# Patient Record
Sex: Female | Born: 2000
Health system: Southern US, Community
[De-identification: ages and names within clinical notes are randomized; demographics above are authoritative.]

## PROBLEM LIST (undated history)

## (undated) DIAGNOSIS — T7840XA Allergy, unspecified, initial encounter: Secondary | ICD-10-CM

## (undated) HISTORY — PX: WISDOM TOOTH EXTRACTION: SHX21

## (undated) HISTORY — DX: Allergy, unspecified, initial encounter: T78.40XA

---
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
## 2018-01-27 ENCOUNTER — Encounter: Payer: Self-pay | Admitting: Family Medicine

## 2018-01-27 ENCOUNTER — Ambulatory Visit (INDEPENDENT_AMBULATORY_CARE_PROVIDER_SITE_OTHER): Payer: Medicaid Other | Admitting: Family Medicine

## 2018-01-27 VITALS — BP 115/81 | Ht 62.0 in | Wt 130.0 lb

## 2018-01-27 DIAGNOSIS — M25562 Pain in left knee: Secondary | ICD-10-CM

## 2018-01-27 DIAGNOSIS — G8929 Other chronic pain: Secondary | ICD-10-CM | POA: Diagnosis not present

## 2018-01-27 DIAGNOSIS — M25561 Pain in right knee: Secondary | ICD-10-CM

## 2018-01-27 NOTE — Progress Notes (Signed)
Subjective:    Mary Glover is a 17 y.o. old female here with bilateral knee pain.  She with her mother.  HPI Knee pain: Chronic issue for the last 5 to 6 years.  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  She denies history of trauma or injury.  Pain is diffuse around her knee mainly anteriorly around the kneecap.  Pain is worse in the right knee than left.  Reports worsening of hip pain over the last 1 months.  She denies unusual activity.  She just graduated from high school.  She also reports intermittent numbness and tingling in her lower legs globally.  She noted some popping or locking when she moves in certain ways.  She also reports some swelling in her lower legs and feet bilaterally when she sits or stands for long time.  She reports multiple ED visits for similar problem over the last for 5 years.  She was told she just had some fluid in her knees and groin pain. She was seen in clinic here for similar problem about 4 months ago.  There was a concern about patellofemoral syndrome and hypermobility syndrome.  She was recommended wearing a knee brace, quad muscle strengthening exercise and naproxen as needed for pain.  She says she has not noticed improvement with the above measures. Denies fever, chills or overlying skin erythema. No family history of rheumatoid disease.  Mom with history of diabetes. She has no PCP. PMH/Problem List: has Patellofemoral pain syndrome of both knees and Hypermobility syndrome on their problem list.   has no past medical history on file.  FH:  No family history on file.  SH Social History   Tobacco Use  . Smoking status: Passive Smoke Exposure - Never Smoker  . Smokeless tobacco: Never Used  Substance Use Topics  . Alcohol use: No  . Drug use: No    Review of Systems Review of systems negative except for pertinent positives and negatives in history of present illness above.     Objective:     Vitals:   01/27/18 1128  BP: 115/81  Weight: 130 lb (59 kg)  Height: 5\' 2"  (1.575 m)   Body mass index is 23.78 kg/m.  Physical Exam  GEN: appears well & comfortable. No apparent distress. Head: normocephalic and atraumatic  CVS: DP pulses 2+ bilaterally MSK:  Knee exam Normal to inspection with no erythema, effusion, swelling or obvious bony abnormalities. No warmth to touch.  Mild vague and diffuse tenderness around her knee anteriorly with palpation ROM full in flexion and extension and lower leg rotation. Patellar glide without crepitus. Notable joint laxity with valgus and varus stress bilaterally. ACL and PCL intact. Mild pain patellar compression. No significant McMurray sign Normal gait  Neurovascularly intact with good distal pulses.  SKIN: no apparent skin lesion Assessment and Plan:  1. Chronic pain of both knees: exam with notable hypermobility in her knee joints which could be contributing to her discomfort.  I have no explanation for her intermittent swelling and numbness and tingling.  She has no swelling today.  Neuro exam within normal limits.  No history and exam suggestive for inflammatory or infectious etiology.  Both patient and mother appears frustrated with lack of good explanation for her pain and swelling.  Recommended continuing quad strengthening exercise and knee brace as needed.  Recommended finding a PCP she can follow up with.   Almon Herculesaye T Gonfa, MD 01/27/18 Pager: [phone]

## 2018-01-27 NOTE — Progress Notes (Signed)
[facility] Attending Note: I have seen and examined this patient. I have discussed this patient with the resident and reviewed the assessment and plan as documented above. I agree with the resident's findings and plan. She is complaining of intermittent severe swelling and pain of knees, legs ankles and feet. Sometimes uniltaeral, sometimes both. Says she cannot bend her knee because it hurts (right knee) but during exam if she is distracted she will bend it and let me bend it and similar results during gait analysis. I find no structural problems with her knees. I see no swelling. She and Mom are frustrated as I tell them this and I recommend they return to their PCP for more comprehensive evaluation.  I suspect there is a more global issue at play .

## 2018-03-01 ENCOUNTER — Ambulatory Visit: Payer: Medicaid Other | Attending: Nurse Practitioner | Admitting: Nurse Practitioner

## 2018-03-01 ENCOUNTER — Other Ambulatory Visit: Payer: Self-pay | Admitting: Nurse Practitioner

## 2018-03-01 ENCOUNTER — Ambulatory Visit (HOSPITAL_COMMUNITY)
Admission: RE | Admit: 2018-03-01 | Discharge: 2018-03-01 | Disposition: A | Discharge status: Home / Self Care | Payer: Medicaid Other | Source: Ambulatory Visit | Attending: Nurse Practitioner | Admitting: Nurse Practitioner

## 2018-03-01 ENCOUNTER — Encounter: Payer: Self-pay | Admitting: Nurse Practitioner

## 2018-03-01 VITALS — BP 113/76 | HR 90 | Temp 99.5°F | Ht 63.0 in | Wt 124.6 lb

## 2018-03-01 DIAGNOSIS — M25561 Pain in right knee: Secondary | ICD-10-CM | POA: Diagnosis not present

## 2018-03-01 DIAGNOSIS — G8929 Other chronic pain: Secondary | ICD-10-CM | POA: Insufficient documentation

## 2018-03-01 DIAGNOSIS — Z833 Family history of diabetes mellitus: Secondary | ICD-10-CM | POA: Insufficient documentation

## 2018-03-01 DIAGNOSIS — M25562 Pain in left knee: Secondary | ICD-10-CM | POA: Diagnosis not present

## 2018-03-01 DIAGNOSIS — Z79899 Other long term (current) drug therapy: Secondary | ICD-10-CM | POA: Insufficient documentation

## 2018-03-01 DIAGNOSIS — Z8249 Family history of ischemic heart disease and other diseases of the circulatory system: Secondary | ICD-10-CM | POA: Insufficient documentation

## 2018-03-01 NOTE — Progress Notes (Signed)
Assessment & Plan:  Mary Glover was seen today for new patient (initial visit).  Diagnoses and all orders for this visit:  Chronic pain of both knees -     Ambulatory referral to Physical Therapy -     DG Knee Complete 4 Views Left; Future -     DG Knee Complete 4 Views Right; Future  May alternate with heat and ice application for pain relief. May also alternate with acetaminophen and Ibuprofen as prescribed for pain. Other alternatives include massage, acupuncture and water aerobics.  You must stay active and avoid a sedentary lifestyle.    Patient has been counseled on age-appropriate routine health concerns for screening and prevention. These are reviewed and up-to-date. Referrals have been placed accordingly. Immunizations are up-to-date or declined.    Subjective:   Chief Complaint  Patient presents with  . New Patient (Initial Visit)    Patient is here for leg pain. Pt. stated she have pain all down from her back to her ankle.   HPI Mary Glover Glover 17 y.o. female presents to office today to establish care and with complaints of bilateral knee pain.  Knee Pain Patient presents for follow up on a knee problem involving the  bilateral knees. Onset of the symptoms was several years ago since the 7th grade.  Inciting event: this is a longstanding problem which has been getting worse. Previous injuries right ankle sprain and muscle strain from a fall many years ago. She did play volleyball and track field.  Current symptoms include pain located patellar pain, popping sensation, stiffness and swelling. Pain is aggravated by prolonged sitting and standing >2-3 hours, sleeping in fetal position.  Patient has had prior knee problems. Evaluation to date: Sports medicine evaluation. Treatment to date: avoidance of offending activity, brace which is somewhat effective, prescription NSAIDS which are somewhat effective and sports medicine evaluation. Relieving factors: stretching.   Review of  Systems  Constitutional: Negative for fever, malaise/fatigue and weight loss.  HENT: Negative.  Negative for nosebleeds.   Eyes: Negative.  Negative for blurred vision, double vision and photophobia.  Respiratory: Negative.  Negative for cough and shortness of breath.   Cardiovascular: Negative.  Negative for chest pain, palpitations and leg swelling.  Gastrointestinal: Negative.  Negative for heartburn, nausea and vomiting.  Musculoskeletal: Positive for back pain and joint pain. Negative for myalgias.       SEE HPI  Neurological: Negative.  Negative for dizziness, focal weakness, seizures and headaches.  Psychiatric/Behavioral: Negative.  Negative for suicidal ideas.    History reviewed. No pertinent past medical history.  History reviewed. No pertinent surgical history.  Family History  Problem Relation Age of Onset  . Diabetes Mother   . Hypertension Father     Social History Reviewed with no changes to be made today.   Outpatient Medications Prior to Visit  Medication Sig Dispense Refill  . amoxicillin-clavulanate (AUGMENTIN) 875-125 MG tablet TAKE 1 TABET BY MOUTH EVERY 12 HOUR  0  . metroNIDAZOLE (FLAGYL) 500 MG tablet Take 1 tablet (500 mg total) by mouth 2 (two) times daily. (Patient not taking: Reported on 03/01/2018) 14 tablet 0  . naproxen (NAPROSYN) 500 MG tablet Take 1 tablet (500 mg total) by mouth 2 (two) times daily as needed. (Patient not taking: Reported on 03/01/2018) 60 tablet 1   No facility-administered medications prior to visit.     No Known Allergies     Objective:    BP 113/76 (BP Location: Right Arm, Patient Position: Sitting,  Cuff Size: Normal)   Pulse 90   Temp 99.5 F (37.5 C) (Oral)   Ht 5\' 3"  (1.6 m)   Wt 124 lb 9.6 oz (56.5 kg)   SpO2 100%   BMI 22.07 kg/m  Wt Readings from Last 3 Encounters:  03/01/18 124 lb 9.6 oz (56.5 kg) (44 %, Z= -0.15)*  01/27/18 130 lb (59 kg) (55 %, Z= 0.12)*  09/23/17 119 lb (54 kg) (35 %, Z= -0.40)*   *  Growth percentiles are based on CDC (Girls, 2-20 Years) data.    Physical Exam  Constitutional: She is oriented to person, place, and time. She appears well-developed and well-nourished. She is cooperative.  HENT:  Head: Normocephalic and atraumatic.  Eyes: EOM are normal.  Neck: Normal range of motion.  Cardiovascular: Normal rate, regular rhythm, normal heart sounds and intact distal pulses. Exam reveals no gallop and no friction rub.  No murmur heard. Pulmonary/Chest: Effort normal and breath sounds normal. No tachypnea. No respiratory distress. She has no decreased breath sounds. She has no wheezes. She has no rhonchi. She has no rales. She exhibits no tenderness.  Abdominal: Soft. Bowel sounds are normal.  Musculoskeletal: Normal range of motion. She exhibits no edema.       Right knee: She exhibits normal range of motion and no swelling. Tenderness found. Patellar tendon tenderness noted.       Left knee: She exhibits normal range of motion and no swelling. Tenderness found. Patellar tendon tenderness noted.  Neurological: She is alert and oriented to person, place, and time. Coordination normal.  Skin: Skin is warm and dry.  Psychiatric: She has a normal mood and affect. Her behavior is normal. Judgment and thought content normal.  Nursing note and vitals reviewed.      Patient has been counseled extensively about nutrition and exercise as well as the importance of adherence with medications and regular follow-up. The patient was given clear instructions to go to ER or return to medical center if symptoms don't improve, worsen or new problems develop. The patient verbalized understanding.   Follow-up: Return for Patient to call and make appointment. Claiborne Rigg, FNP-BC [facility] and [facility] [place], [place] [phone]   03/01/2018, 10:14 AM

## 2018-03-01 NOTE — Patient Instructions (Signed)
Knee Exercises Ask your health care provider which exercises are safe for you. Do exercises exactly as told by your health care provider and adjust them as directed. It is normal to feel mild stretching, pulling, tightness, or discomfort as you do these exercises, but you should stop right away if you feel sudden pain or your pain gets worse.Do not begin these exercises until told by your health care provider. STRETCHING AND RANGE OF MOTION EXERCISES These exercises warm up your muscles and joints and improve the movement and flexibility of your knee. These exercises also help to relieve pain, numbness, and tingling. Exercise A: Knee Extension, Prone 1. Lie on your abdomen on a bed. 2. Place your left / right knee just beyond the edge of the surface so your knee is not on the bed. You can put a towel under your left / right thigh just above your knee for comfort. 3. Relax your leg muscles and allow gravity to straighten your knee. You should feel a stretch behind your left / right knee. 4. Hold this position for __________ seconds. 5. Scoot up so your knee is supported between repetitions. Repeat __________ times. Complete this stretch __________ times a day. Exercise B: Knee Flexion, Active  1. Lie on your back with both knees straight. If this causes back discomfort, bend your left / right knee so your foot is flat on the floor. 2. Slowly slide your left / right heel back toward your buttocks until you feel a gentle stretch in the front of your knee or thigh. 3. Hold this position for __________ seconds. 4. Slowly slide your left / right heel back to the starting position. Repeat __________ times. Complete this exercise __________ times a day. Exercise C: Quadriceps, Prone  1. Lie on your abdomen on a firm surface, such as a bed or padded floor. 2. Bend your left / right knee and hold your ankle. If you cannot reach your ankle or pant leg, loop a belt around your foot and grab the belt  instead. 3. Gently pull your heel toward your buttocks. Your knee should not slide out to the side. You should feel a stretch in the front of your thigh and knee. 4. Hold this position for __________ seconds. Repeat __________ times. Complete this stretch __________ times a day. Exercise D: Hamstring, Supine 1. Lie on your back. 2. Loop a belt or towel over the ball of your left / right foot. The ball of your foot is on the walking surface, right under your toes. 3. Straighten your left / right knee and slowly pull on the belt to raise your leg until you feel a gentle stretch behind your knee. ? Do not let your left / right knee bend while you do this. ? Keep your other leg flat on the floor. 4. Hold this position for __________ seconds. Repeat __________ times. Complete this stretch __________ times a day. STRENGTHENING EXERCISES These exercises build strength and endurance in your knee. Endurance is the ability to use your muscles for a long time, even after they get tired. Exercise E: Quadriceps, Isometric  1. Lie on your back with your left / right leg extended and your other knee bent. Put a rolled towel or small pillow under your knee if told by your health care provider. 2. Slowly tense the muscles in the front of your left / right thigh. You should see your kneecap slide up toward your hip or see increased dimpling just above the knee. This   motion will push the back of the knee toward the floor. 3. For __________ seconds, keep the muscle as tight as you can without increasing your pain. 4. Relax the muscles slowly and completely. Repeat __________ times. Complete this exercise __________ times a day. Exercise F: Straight Leg Raises - Quadriceps 1. Lie on your back with your left / right leg extended and your other knee bent. 2. Tense the muscles in the front of your left / right thigh. You should see your kneecap slide up or see increased dimpling just above the knee. Your thigh may  even shake a bit. 3. Keep these muscles tight as you raise your leg 4-6 inches (10-15 cm) off the floor. Do not let your knee bend. 4. Hold this position for __________ seconds. 5. Keep these muscles tense as you lower your leg. 6. Relax your muscles slowly and completely after each repetition. Repeat __________ times. Complete this exercise __________ times a day. Exercise G: Hamstring, Isometric 1. Lie on your back on a firm surface. 2. Bend your left / right knee approximately __________ degrees. 3. Dig your left / right heel into the surface as if you are trying to pull it toward your buttocks. Tighten the muscles in the back of your thighs to dig as hard as you can without increasing any pain. 4. Hold this position for __________ seconds. 5. Release the tension gradually and allow your muscles to relax completely for __________ seconds after each repetition. Repeat __________ times. Complete this exercise __________ times a day. Exercise H: Hamstring Curls  If told by your health care provider, do this exercise while wearing ankle weights. Begin with __________ weights. Then increase the weight by 1 lb (0.5 kg) increments. Do not wear ankle weights that are more than __________. 1. Lie on your abdomen with your legs straight. 2. Bend your left / right knee as far as you can without feeling pain. Keep your hips flat against the floor. 3. Hold this position for __________ seconds. 4. Slowly lower your leg to the starting position.  Repeat __________ times. Complete this exercise __________ times a day. Exercise I: Squats (Quadriceps) 1. Stand in front of a table, with your feet and knees pointing straight ahead. You may rest your hands on the table for balance but not for support. 2. Slowly bend your knees and lower your hips like you are going to sit in a chair. ? Keep your weight over your heels, not over your toes. ? Keep your lower legs upright so they are parallel with the table  legs. ? Do not let your hips go lower than your knees. ? Do not bend lower than told by your health care provider. ? If your knee pain increases, do not bend as low. 3. Hold the squat position for __________ seconds. 4. Slowly push with your legs to return to standing. Do not use your hands to pull yourself to standing. Repeat __________ times. Complete this exercise __________ times a day. Exercise J: Wall Slides (Quadriceps)  1. Lean your back against a smooth wall or door while you walk your feet out 18-24 inches (46-61 cm) from it. 2. Place your feet hip-width apart. 3. Slowly slide down the wall or door until your knees bend __________ degrees. Keep your knees over your heels, not over your toes. Keep your knees in line with your hips. 4. Hold for __________ seconds. Repeat __________ times. Complete this exercise __________ times a day. Exercise K: Straight Leg Raises -   Hip Abductors 1. Lie on your side with your left / right leg in the top position. Lie so your head, shoulder, knee, and hip line up. You may bend your bottom knee to help you keep your balance. 2. Roll your hips slightly forward so your hips are stacked directly over each other and your left / right knee is facing forward. 3. Leading with your heel, lift your top leg 4-6 inches (10-15 cm). You should feel the muscles in your outer hip lifting. ? Do not let your foot drift forward. ? Do not let your knee roll toward the ceiling. 4. Hold this position for __________ seconds. 5. Slowly return your leg to the starting position. 6. Let your muscles relax completely after each repetition. Repeat __________ times. Complete this exercise __________ times a day. Exercise L: Straight Leg Raises - Hip Extensors 1. Lie on your abdomen on a firm surface. You can put a pillow under your hips if that is more comfortable. 2. Tense the muscles in your buttocks and lift your left / right leg about 4-6 inches (10-15 cm). Keep your knee  straight as you lift your leg. 3. Hold this position for __________ seconds. 4. Slowly lower your leg to the starting position. 5. Let your leg relax completely after each repetition. Repeat __________ times. Complete this exercise __________ times a day. This information is not intended to replace advice given to you by your health care provider. Make sure you discuss any questions you have with your health care provider. Document Released: 06/02/2005 Document Revised: 04/12/2016 Document Reviewed: 05/25/2015 Elsevier Interactive Patient Education  2018 [employer].  Patellofemoral Pain Syndrome Patellofemoral pain syndrome is a condition that involves a softening or breakdown of the tissue (cartilage) on the underside of your kneecap (patella). This causes pain in the front of the knee. The condition is also called runner's knee or chondromalacia patella. Patellofemoral pain syndrome is most common in young adults who are active in sports. Your knee is the largest joint in your body. The patella covers the front of your knee and is attached to muscles above and below your knee. The underside of the patella is covered with a smooth type of cartilage (synovium). The smooth surface helps the patella glide easily when you move your knee. Patellofemoral pain syndrome causes swelling in the joint linings and bone surfaces in your knee. What are the causes? Patellofemoral pain syndrome can be caused by:  Overuse.  Poor alignment of your knee joints.  Weak leg muscles.  A direct blow to your kneecap.  What increases the risk? You may be at risk for patellofemoral pain syndrome if you:  Do a lot of activities that can wear down your kneecap. These include: ? Running. ? Squatting. ? Climbing stairs.  Start a new physical activity or exercise program.  Wear shoes that do not fit well.  Do not have good leg strength.  Are overweight.  What are the signs or symptoms? Knee pain is the  most common symptom of patellofemoral pain syndrome. This may feel like a dull, aching pain underneath your patella, in the front of your knee. There may be a popping or cracking sound when you move your knee. Pain may get worse with:  Exercise.  Climbing stairs.  Running.  Jumping.  Squatting.  Kneeling.  Sitting for a long time.  Moving or pushing on your patella.  How is this diagnosed? Your health care provider may be able to diagnose patellofemoral pain syndrome  from your symptoms and medical history. You may be asked about your recent physical activities and which ones cause knee pain. Your health care provider may do a physical exam with certain tests to confirm the diagnosis. These may include:  Moving your patella back and forth.  Checking your range of knee motion.  Having you squat or jump to see if you have pain.  Checking the strength of your leg muscles.  An MRI of the knee may also be done. How is this treated? Patellofemoral pain syndrome can usually be treated at home with rest, ice, compression, and elevation (RICE). Other treatments may include:  Nonsteroidal anti-inflammatory drugs (NSAIDs).  Physical therapy to stretch and strengthen your leg muscles.  Shoe inserts (orthotics) to take stress off your knee.  A knee brace or knee support.  Surgery to remove damaged cartilage or move the patella to a better position. The need for surgery is rare.  Follow these instructions at home:  Take medicines only as directed by your health care provider.  Rest your knee. ? When resting, keep your knee raised above the level of your heart. ? Avoid activities that cause knee pain.  Apply ice to the injured area: ? Put ice in a plastic bag. ? Place a towel between your skin and the bag. ? Leave the ice on for 20 minutes, 2-3 times a day.  Use splints, braces, knee supports, or walking aids as directed by your health care provider.  Perform stretching and  strengthening exercises as directed by your health care provider or physical therapist.  Keep all follow-up visits as directed by your health care provider. This is important. Contact a health care provider if:  Your symptoms get worse.  You are not improving with home care. This information is not intended to replace advice given to you by your health care provider. Make sure you discuss any questions you have with your health care provider. Document Released: 07/07/2009 Document Revised: 12/25/2015 Document Reviewed: 10/08/2013 Elsevier Interactive Patient Education  2018 [employer] Inc[employer]  Knee Pain, Adult Many things can cause knee pain. The pain often goes away on its own with time and rest. If the pain does not go away, tests may be done to find out what is causing the pain. Follow these instructions at home: Activity  Rest your knee.  Do not do things that cause pain.  Avoid activities where both feet leave the ground at the same time (high-impact activities). Examples are running, jumping rope, and doing jumping jacks. General instructions  Take medicines only as told by your doctor.  Raise (elevate) your knee when you are resting. Make sure your knee is higher than your heart.  Sleep with a pillow under your knee.  If told, put ice on the knee: ? Put ice in a plastic bag. ? Place a towel between your skin and the bag. ? Leave the ice on for 20 minutes, 2-3 times a day.  Ask your doctor if you should wear an elastic knee support.  Lose weight if you are overweight. Being overweight can make your knee hurt more.  Do not use any tobacco products. These include cigarettes, chewing tobacco, or electronic cigarettes. If you need help quitting, ask your doctor. Smoking may slow down healing. Contact a doctor if:  The pain does not stop.  The pain changes or gets worse.  You have a fever along with knee pain.  Your knee gives out or locks up.  Your knee swells,  and  becomes worse. Get help right away if:  Your knee feels warm.  You cannot move your knee.  You have very bad knee pain.  You have chest pain.  You have trouble breathing. Summary  Many things can cause knee pain. The pain often goes away on its own with time and rest.  Avoid activities that put stress on your knee. These include running and jumping rope.  Get help right away if you cannot move your knee, or if your knee feels warm, or if you have trouble breathing. This information is not intended to replace advice given to you by your health care provider. Make sure you discuss any questions you have with your health care provider. Document Released: 10/15/2008 Document Revised: 07/13/2016 Document Reviewed: 07/13/2016 Elsevier Interactive Patient Education  2017 [employer]

## 2018-03-02 ENCOUNTER — Telehealth: Payer: Self-pay

## 2018-03-02 NOTE — Telephone Encounter (Signed)
-----   Message from Claiborne RiggZelda W Fleming, NP sent at 03/01/2018 10:21 PM EDT ----- Left knee xray is normal. Will refer to PT

## 2018-03-02 NOTE — Telephone Encounter (Signed)
CMA attempt to call patient to inform on XRAY results.  No answer and left a VM for patient to call back.  If patient call back, please inform:  Right knee xray is normal  Left knee xray is normal. Will refer to PT

## 2018-03-02 NOTE — Telephone Encounter (Signed)
-----   Message from Claiborne RiggZelda W Fleming, NP sent at 03/01/2018 10:20 PM EDT ----- Right knee xray is normal

## 2018-03-07 ENCOUNTER — Telehealth: Payer: Self-pay | Admitting: Nurse Practitioner

## 2018-03-07 ENCOUNTER — Other Ambulatory Visit: Payer: Self-pay | Admitting: Nurse Practitioner

## 2018-03-07 NOTE — Telephone Encounter (Signed)
Please call the patient at [phone]

## 2018-03-10 NOTE — Telephone Encounter (Signed)
Left a message for the patient to call me back to be scheduled.

## 2018-03-14 ENCOUNTER — Ambulatory Visit: Payer: Medicaid Other | Admitting: Physical Therapy

## 2018-03-15 ENCOUNTER — Ambulatory Visit: Payer: Medicaid Other | Attending: Nurse Practitioner | Admitting: Nurse Practitioner

## 2018-03-15 ENCOUNTER — Encounter: Payer: Self-pay | Admitting: Nurse Practitioner

## 2018-03-15 ENCOUNTER — Other Ambulatory Visit: Payer: Self-pay

## 2018-03-15 ENCOUNTER — Encounter: Payer: Self-pay | Admitting: Physical Therapy

## 2018-03-15 ENCOUNTER — Ambulatory Visit: Payer: Medicaid Other | Attending: Nurse Practitioner | Admitting: Physical Therapy

## 2018-03-15 VITALS — BP 109/69 | HR 87 | Temp 98.9°F | Ht 63.0 in | Wt 122.6 lb

## 2018-03-15 DIAGNOSIS — Z0000 Encounter for general adult medical examination without abnormal findings: Secondary | ICD-10-CM | POA: Diagnosis not present

## 2018-03-15 DIAGNOSIS — M7989 Other specified soft tissue disorders: Secondary | ICD-10-CM | POA: Insufficient documentation

## 2018-03-15 DIAGNOSIS — M25561 Pain in right knee: Secondary | ICD-10-CM | POA: Diagnosis not present

## 2018-03-15 DIAGNOSIS — Z79899 Other long term (current) drug therapy: Secondary | ICD-10-CM | POA: Diagnosis not present

## 2018-03-15 DIAGNOSIS — M25562 Pain in left knee: Secondary | ICD-10-CM

## 2018-03-15 DIAGNOSIS — R262 Difficulty in walking, not elsewhere classified: Secondary | ICD-10-CM | POA: Insufficient documentation

## 2018-03-15 DIAGNOSIS — G8929 Other chronic pain: Secondary | ICD-10-CM | POA: Insufficient documentation

## 2018-03-15 DIAGNOSIS — R600 Localized edema: Secondary | ICD-10-CM | POA: Insufficient documentation

## 2018-03-15 NOTE — Progress Notes (Signed)
Assessment & Plan:  Mary Glover was seen today for follow-up.  Diagnoses and all orders for this visit:  Chronic pain of both knees -     Rheumatoid factor -     ANA -     Sedimentation Rate  Routine health maintenance -     CBC -     Basic metabolic panel    Patient has been counseled on age-appropriate routine health concerns for screening and prevention. These are reviewed and up-to-date. Referrals have been placed accordingly. Immunizations are up-to-date or declined.    Subjective:   Chief Complaint  Patient presents with  . Follow-up    Pt. is here for lab. Pt. stated her feet and knee are swollen.    HPI Mary Glover 17 y.o. female presents to office today for follow up to chronic bilateral knee pain and swelling.   Knee Pain Recent Xrays of both knees (03-01-2018) were negative for any osseous abnormalities, effusions or fractures. I have referred her for OP Physical Therapy. She will see the physical therapist for the first time today. Today she continues to endorse pain and swelling in her knees and feet. Pain is greater in the right knee. She also endorses dull aching low back from that has been persistent from a fall over 3 years ago. Xray at that time was negative.  I have instructed her to speak with the physical therapist today regarding her back pain as well. There may be some helpful exercises she can perform to assist with her low back pain. She has taken ibuprofen in the past and endorses good relief of symptoms.     Review of Systems  Constitutional: Negative for fever, malaise/fatigue and weight loss.  HENT: Negative.  Negative for nosebleeds.   Eyes: Negative.  Negative for blurred vision, double vision and photophobia.  Respiratory: Negative.  Negative for cough and shortness of breath.   Cardiovascular: Positive for leg swelling. Negative for chest pain and palpitations.  Gastrointestinal: Negative.  Negative for heartburn, nausea and vomiting.    Musculoskeletal: Positive for back pain, joint pain and myalgias.  Neurological: Negative.  Negative for dizziness, focal weakness, seizures and headaches.  Psychiatric/Behavioral: Negative.  Negative for suicidal ideas.    History reviewed. No pertinent past medical history.  History reviewed. No pertinent surgical history.  Family History  Problem Relation Age of Onset  . Diabetes Mother   . Hypertension Father     Social History Reviewed with no changes to be made today.   Outpatient Medications Prior to Visit  Medication Sig Dispense Refill  . amoxicillin-clavulanate (AUGMENTIN) 875-125 MG tablet TAKE 1 TABET BY MOUTH EVERY 12 HOUR  0  . metroNIDAZOLE (FLAGYL) 500 MG tablet Take 1 tablet (500 mg total) by mouth 2 (two) times daily. (Patient not taking: Reported on 03/01/2018) 14 tablet 0  . naproxen (NAPROSYN) 500 MG tablet Take 1 tablet (500 mg total) by mouth 2 (two) times daily as needed. (Patient not taking: Reported on 03/01/2018) 60 tablet 1   No facility-administered medications prior to visit.     No Known Allergies     Objective:    BP 109/69 (BP Location: Right Arm, Patient Position: Sitting, Cuff Size: Normal)   Pulse 87   Temp 98.9 F (37.2 C) (Oral)   Ht 5\' 3"  (1.6 m)   Wt 122 lb 9.6 oz (55.6 kg)   SpO2 99%   BMI 21.72 kg/m  Wt Readings from Last 3 Encounters:  03/15/18 122 lb 9.6  oz (55.6 kg) (40 %, Z= -0.25)*  03/01/18 124 lb 9.6 oz (56.5 kg) (44 %, Z= -0.15)*  01/27/18 130 lb (59 kg) (55 %, Z= 0.12)*   * Growth percentiles are based on CDC (Girls, 2-20 Years) data.    Physical Exam  Constitutional: She is oriented to person, place, and time. She appears well-developed and well-nourished. She is cooperative.  HENT:  Head: Normocephalic and atraumatic.  Eyes: EOM are normal.  Neck: Normal range of motion.  Cardiovascular: Normal rate, regular rhythm and normal heart sounds. Exam reveals no gallop and no friction rub.  No murmur  heard. Pulmonary/Chest: Effort normal and breath sounds normal. No tachypnea. No respiratory distress. She has no decreased breath sounds. She has no wheezes. She has no rhonchi. She has no rales. She exhibits no tenderness.  Abdominal: Bowel sounds are normal.  Musculoskeletal: Normal range of motion. She exhibits no edema.       Right knee: She exhibits normal range of motion, no swelling and no effusion.       Left knee: She exhibits normal range of motion, no swelling and no effusion.       Right foot: There is no swelling.       Left foot: There is no swelling.  Neurological: She is alert and oriented to person, place, and time. Coordination normal.  Skin: Skin is warm and dry.  Psychiatric: She has a normal mood and affect. Her behavior is normal. Judgment and thought content normal.  Nursing note and vitals reviewed.        Patient has been counseled extensively about nutrition and exercise as well as the importance of adherence with medications and regular follow-up. The patient was given clear instructions to go to ER or return to medical center if symptoms don't improve, worsen or new problems develop. The patient verbalized understanding.   Follow-up: Return in about 3 months (around 06/15/2018) for she can call to schedule around fall break or thanksgiving break.   Claiborne RiggZelda W Trinity Haun, FNP-BC [facility] [place], [place] [phone]   03/15/2018, 9:00 AM

## 2018-03-15 NOTE — Patient Instructions (Signed)
Back Exercises If you have pain in your back, do these exercises 2-3 times each day or as told by your doctor. When the pain goes away, do the exercises once each day, but repeat the steps more times for each exercise (do more repetitions). If you do not have pain in your back, do these exercises once each day or as told by your doctor. Exercises Single Knee to Chest  Do these steps 3-5 times in a row for each leg: 1. Lie on your back on a firm bed or the floor with your legs stretched out. 2. Bring one knee to your chest. 3. Hold your knee to your chest by grabbing your knee or thigh. 4. Pull on your knee until you feel a gentle stretch in your lower back. 5. Keep doing the stretch for 10-30 seconds. 6. Slowly let go of your leg and straighten it.  Pelvic Tilt  Do these steps 5-10 times in a row: 1. Lie on your back on a firm bed or the floor with your legs stretched out. 2. Bend your knees so they point up to the ceiling. Your feet should be flat on the floor. 3. Tighten your lower belly (abdomen) muscles to press your lower back against the floor. This will make your tailbone point up to the ceiling instead of pointing down to your feet or the floor. 4. Stay in this position for 5-10 seconds while you gently tighten your muscles and breathe evenly.  Cat-Cow  Do these steps until your lower back bends more easily: 1. Get on your hands and knees on a firm surface. Keep your hands under your shoulders, and keep your knees under your hips. You may put padding under your knees. 2. Let your head hang down, and make your tailbone point down to the floor so your lower back is round like the back of a cat. 3. Stay in this position for 5 seconds. 4. Slowly lift your head and make your tailbone point up to the ceiling so your back hangs low (sags) like the back of a cow. 5. Stay in this position for 5 seconds.  Press-Ups  Do these steps 5-10 times in a row: 1. Lie on your belly (face-down)  on the floor. 2. Place your hands near your head, about shoulder-width apart. 3. While you keep your back relaxed and keep your hips on the floor, slowly straighten your arms to raise the top half of your body and lift your shoulders. Do not use your back muscles. To make yourself more comfortable, you may change where you place your hands. 4. Stay in this position for 5 seconds. 5. Slowly return to lying flat on the floor.  Bridges  Do these steps 10 times in a row: 1. Lie on your back on a firm surface. 2. Bend your knees so they point up to the ceiling. Your feet should be flat on the floor. 3. Tighten your butt muscles and lift your butt off of the floor until your waist is almost as high as your knees. If you do not feel the muscles working in your butt and the back of your thighs, slide your feet 1-2 inches farther away from your butt. 4. Stay in this position for 3-5 seconds. 5. Slowly lower your butt to the floor, and let your butt muscles relax.  If this exercise is too easy, try doing it with your arms crossed over your chest. Belly Crunches  Do these steps 5-10 times in   a row: 1. Lie on your back on a firm bed or the floor with your legs stretched out. 2. Bend your knees so they point up to the ceiling. Your feet should be flat on the floor. 3. Cross your arms over your chest. 4. Tip your chin a little bit toward your chest but do not bend your neck. 5. Tighten your belly muscles and slowly raise your chest just enough to lift your shoulder blades a tiny bit off of the floor. 6. Slowly lower your chest and your head to the floor.  Back Lifts Do these steps 5-10 times in a row: 1. Lie on your belly (face-down) with your arms at your sides, and rest your forehead on the floor. 2. Tighten the muscles in your legs and your butt. 3. Slowly lift your chest off of the floor while you keep your hips on the floor. Keep the back of your head in line with the curve in your back. Look at  the floor while you do this. 4. Stay in this position for 3-5 seconds. 5. Slowly lower your chest and your face to the floor.  Contact a doctor if:  Your back pain gets a lot worse when you do an exercise.  Your back pain does not lessen 2 hours after you exercise. If you have any of these problems, stop doing the exercises. Do not do them again unless your doctor says it is okay. Get help right away if:  You have sudden, very bad back pain. If this happens, stop doing the exercises. Do not do them again unless your doctor says it is okay. This information is not intended to replace advice given to you by your health care provider. Make sure you discuss any questions you have with your health care provider. Document Released: 08/21/2010 Document Revised: 12/25/2015 Document Reviewed: 09/12/2014 Elsevier Interactive Patient Education  2018 [employer]  Chronic Back Pain When back pain lasts longer than 3 months, it is called chronic back pain.The cause of your back pain may not be known. Some common causes include:  Wear and tear (degenerative disease) of the bones, ligaments, or disks in your back.  Inflammation and stiffness in your back (arthritis).  People who have chronic back pain often go through certain periods in which the pain is more intense (flare-ups). Many people can learn to manage the pain with home care. Follow these instructions at home: Pay attention to any changes in your symptoms. Take these actions to help with your pain: Activity  Avoid bending and activities that make the problem worse.  Do not sit or stand in one place for long periods of time.  Take brief periods of rest throughout the day. This will reduce your pain. Resting in a lying or standing position is usually better than sitting to rest.  When you are resting for longer periods, mix in some mild activity or stretching between periods of rest. This will help to prevent stiffness and pain.  Get  regular exercise. Ask your health care provider what activities are safe for you.  Do not lift anything that is heavier than 10 lb (4.5 kg). Always use proper lifting technique, which includes: ? Bending your knees. ? Keeping the load close to your body. ? Avoiding twisting. Managing pain  If directed, apply ice to the painful area. Your health care provider may recommend applying ice during the first 24-48 hours after a flare-up begins. ? Put ice in a plastic bag. ? Place  a towel between your skin and the bag. ? Leave the ice on for 20 minutes, 2-3 times per day.  After icing, apply heat to the affected area as often as told by your health care provider. Use the heat source that your health care provider recommends, such as a moist heat pack or a heating pad. ? Place a towel between your skin and the heat source. ? Leave the heat on for 20-30 minutes. ? Remove the heat if your skin turns bright red. This is especially important if you are unable to feel pain, heat, or cold. You may have a greater risk of getting burned.  Try soaking in a warm tub.  Take over-the-counter and prescription medicines only as told by your health care provider.  Keep all follow-up visits as told by your health care provider. This is important. Contact a health care provider if:  You have pain that is not relieved with rest or medicine. Get help right away if:  You have weakness or numbness in one or both of your legs or feet.  You have trouble controlling your bladder or your bowels.  You have nausea or vomiting.  You have pain in your abdomen.  You have shortness of breath or you faint. This information is not intended to replace advice given to you by your health care provider. Make sure you discuss any questions you have with your health care provider. Document Released: 08/26/2004 Document Revised: 11/27/2015 Document Reviewed: 01/06/2015 Elsevier Interactive Patient Education  2018 [employer]  Low Back Sprain Rehab Ask your health care provider which exercises are safe for you. Do exercises exactly as told by your health care provider and adjust them as directed. It is normal to feel mild stretching, pulling, tightness, or discomfort as you do these exercises, but you should stop right away if you feel sudden pain or your pain gets worse. Do not begin these exercises until told by your health care provider. Stretching and range of motion exercises These exercises warm up your muscles and joints and improve the movement and flexibility of your back. These exercises also help to relieve pain, numbness, and tingling. Exercise A: Lumbar rotation  1. Lie on your back on a firm surface and bend your knees. 2. Straighten your arms out to your sides so each arm forms an "L" shape with a side of your body (a 90 degree angle). 3. Slowly move both of your knees to one side of your body until you feel a stretch in your lower back. Try not to let your shoulders move off of the floor. 4. Hold for __________ seconds. 5. Tense your abdominal muscles and slowly move your knees back to the starting position. 6. Repeat this exercise on the other side of your body. Repeat __________ times. Complete this exercise __________ times a day. Exercise B: Prone extension on elbows  1. Lie on your abdomen on a firm surface. 2. Prop yourself up on your elbows. 3. Use your arms to help lift your chest up until you feel a gentle stretch in your abdomen and your lower back. ? This will place some of your body weight on your elbows. If this is uncomfortable, try stacking pillows under your chest. ? Your hips should stay down, against the surface that you are lying on. Keep your hip and back muscles relaxed. 4. Hold for __________ seconds. 5. Slowly relax your upper body and return to the starting position. Repeat __________ times. Complete this exercise  __________ times a day. Strengthening exercises These  exercises build strength and endurance in your back. Endurance is the ability to use your muscles for a long time, even after they get tired. Exercise C: Pelvic tilt 1. Lie on your back on a firm surface. Bend your knees and keep your feet flat. 2. Tense your abdominal muscles. Tip your pelvis up toward the ceiling and flatten your lower back into the floor. ? To help with this exercise, you may place a small towel under your lower back and try to push your back into the towel. 3. Hold for __________ seconds. 4. Let your muscles relax completely before you repeat this exercise. Repeat __________ times. Complete this exercise __________ times a day. Exercise D: Alternating arm and leg raises  1. Get on your hands and knees on a firm surface. If you are on a hard floor, you may want to use padding to cushion your knees, such as an exercise mat. 2. Line up your arms and legs. Your hands should be below your shoulders, and your knees should be below your hips. 3. Lift your left leg behind you. At the same time, raise your right arm and straighten it in front of you. ? Do not lift your leg higher than your hip. ? Do not lift your arm higher than your shoulder. ? Keep your abdominal and back muscles tight. ? Keep your hips facing the ground. ? Do not arch your back. ? Keep your balance carefully, and do not hold your breath. 4. Hold for __________ seconds. 5. Slowly return to the starting position and repeat with your right leg and your left arm. Repeat __________ times. Complete this exercise __________ times a day. Exercise E: Abdominal set with straight leg raise  1. Lie on your back on a firm surface. 2. Bend one of your knees and keep your other leg straight. 3. Tense your abdominal muscles and lift your straight leg up, 4-6 inches (10-15 cm) off the ground. 4. Keep your abdominal muscles tight and hold for __________ seconds. ? Do not hold your breath. ? Do not arch your back. Keep it  flat against the ground. 5. Keep your abdominal muscles tense as you slowly lower your leg back to the starting position. 6. Repeat with your other leg. Repeat __________ times. Complete this exercise __________ times a day. Posture and body mechanics  Body mechanics refers to the movements and positions of your body while you do your daily activities. Posture is part of body mechanics. Good posture and healthy body mechanics can help to relieve stress in your body's tissues and joints. Good posture means that your spine is in its natural S-curve position (your spine is neutral), your shoulders are pulled back slightly, and your head is not tipped forward. The following are general guidelines for applying improved posture and body mechanics to your everyday activities. Standing   When standing, keep your spine neutral and your feet about hip-width apart. Keep a slight bend in your knees. Your ears, shoulders, and hips should line up.  When you do a task in which you stand in one place for a long time, place one foot up on a stable object that is 2-4 inches (5-10 cm) high, such as a footstool. This helps keep your spine neutral. Sitting   When sitting, keep your spine neutral and keep your feet flat on the floor. Use a footrest, if necessary, and keep your thighs parallel to the floor. Avoid rounding your  shoulders, and avoid tilting your head forward.  When working at a desk or a computer, keep your desk at a height where your hands are slightly lower than your elbows. Slide your chair under your desk so you are close enough to maintain good posture.  When working at a computer, place your monitor at a height where you are looking straight ahead and you do not have to tilt your head forward or downward to look at the screen. Resting   When lying down and resting, avoid positions that are most painful for you.  If you have pain with activities such as sitting, bending, stooping, or squatting  (flexion-based activities), lie in a position in which your body does not bend very much. For example, avoid curling up on your side with your arms and knees near your chest (fetal position).  If you have pain with activities such as standing for a long time or reaching with your arms (extension-based activities), lie with your spine in a neutral position and bend your knees slightly. Try the following positions:  Lying on your side with a pillow between your knees.  Lying on your back with a pillow under your knees. Lifting   When lifting objects, keep your feet at least shoulder-width apart and tighten your abdominal muscles.  Bend your knees and hips and keep your spine neutral. It is important to lift using the strength of your legs, not your back. Do not lock your knees straight out.  Always ask for help to lift heavy or awkward objects. This information is not intended to replace advice given to you by your health care provider. Make sure you discuss any questions you have with your health care provider. Document Released: 07/19/2005 Document Revised: 03/25/2016 Document Reviewed: 04/30/2015 Elsevier Interactive Patient Education  2018 [employer].  Low Back Sprain Rehab Ask your health care provider which exercises are safe for you. Do exercises exactly as told by your health care provider and adjust them as directed. It is normal to feel mild stretching, pulling, tightness, or discomfort as you do these exercises, but you should stop right away if you feel sudden pain or your pain gets worse. Do not begin these exercises until told by your health care provider. Stretching and range of motion exercises These exercises warm up your muscles and joints and improve the movement and flexibility of your back. These exercises also help to relieve pain, numbness, and tingling. Exercise A: Lumbar rotation  7. Lie on your back on a firm surface and bend your knees. 8. Straighten your arms  out to your sides so each arm forms an "L" shape with a side of your body (a 90 degree angle). 9. Slowly move both of your knees to one side of your body until you feel a stretch in your lower back. Try not to let your shoulders move off of the floor. 10. Hold for __________ seconds. 11. Tense your abdominal muscles and slowly move your knees back to the starting position. 12. Repeat this exercise on the other side of your body. Repeat __________ times. Complete this exercise __________ times a day. Exercise B: Prone extension on elbows  6. Lie on your abdomen on a firm surface. 7. Prop yourself up on your elbows. 8. Use your arms to help lift your chest up until you feel a gentle stretch in your abdomen and your lower back. ? This will place some of your body weight on your elbows. If this is uncomfortable,  try stacking pillows under your chest. ? Your hips should stay down, against the surface that you are lying on. Keep your hip and back muscles relaxed. 9. Hold for __________ seconds. 10. Slowly relax your upper body and return to the starting position. Repeat __________ times. Complete this exercise __________ times a day. Strengthening exercises These exercises build strength and endurance in your back. Endurance is the ability to use your muscles for a long time, even after they get tired. Exercise C: Pelvic tilt 5. Lie on your back on a firm surface. Bend your knees and keep your feet flat. 6. Tense your abdominal muscles. Tip your pelvis up toward the ceiling and flatten your lower back into the floor. ? To help with this exercise, you may place a small towel under your lower back and try to push your back into the towel. 7. Hold for __________ seconds. 8. Let your muscles relax completely before you repeat this exercise. Repeat __________ times. Complete this exercise __________ times a day. Exercise D: Alternating arm and leg raises  6. Get on your hands and knees on a firm  surface. If you are on a hard floor, you may want to use padding to cushion your knees, such as an exercise mat. 7. Line up your arms and legs. Your hands should be below your shoulders, and your knees should be below your hips. 8. Lift your left leg behind you. At the same time, raise your right arm and straighten it in front of you. ? Do not lift your leg higher than your hip. ? Do not lift your arm higher than your shoulder. ? Keep your abdominal and back muscles tight. ? Keep your hips facing the ground. ? Do not arch your back. ? Keep your balance carefully, and do not hold your breath. 9. Hold for __________ seconds. 10. Slowly return to the starting position and repeat with your right leg and your left arm. Repeat __________ times. Complete this exercise __________ times a day. Exercise E: Abdominal set with straight leg raise  7. Lie on your back on a firm surface. 8. Bend one of your knees and keep your other leg straight. 9. Tense your abdominal muscles and lift your straight leg up, 4-6 inches (10-15 cm) off the ground. 10. Keep your abdominal muscles tight and hold for __________ seconds. ? Do not hold your breath. ? Do not arch your back. Keep it flat against the ground. 11. Keep your abdominal muscles tense as you slowly lower your leg back to the starting position. 12. Repeat with your other leg. Repeat __________ times. Complete this exercise __________ times a day. Posture and body mechanics  Body mechanics refers to the movements and positions of your body while you do your daily activities. Posture is part of body mechanics. Good posture and healthy body mechanics can help to relieve stress in your body's tissues and joints. Good posture means that your spine is in its natural S-curve position (your spine is neutral), your shoulders are pulled back slightly, and your head is not tipped forward. The following are general guidelines for applying improved posture and body  mechanics to your everyday activities. Standing   When standing, keep your spine neutral and your feet about hip-width apart. Keep a slight bend in your knees. Your ears, shoulders, and hips should line up.  When you do a task in which you stand in one place for a long time, place one foot up on a stable object  that is 2-4 inches (5-10 cm) high, such as a footstool. This helps keep your spine neutral. Sitting   When sitting, keep your spine neutral and keep your feet flat on the floor. Use a footrest, if necessary, and keep your thighs parallel to the floor. Avoid rounding your shoulders, and avoid tilting your head forward.  When working at a desk or a computer, keep your desk at a height where your hands are slightly lower than your elbows. Slide your chair under your desk so you are close enough to maintain good posture.  When working at a computer, place your monitor at a height where you are looking straight ahead and you do not have to tilt your head forward or downward to look at the screen. Resting   When lying down and resting, avoid positions that are most painful for you.  If you have pain with activities such as sitting, bending, stooping, or squatting (flexion-based activities), lie in a position in which your body does not bend very much. For example, avoid curling up on your side with your arms and knees near your chest (fetal position).  If you have pain with activities such as standing for a long time or reaching with your arms (extension-based activities), lie with your spine in a neutral position and bend your knees slightly. Try the following positions:  Lying on your side with a pillow between your knees.  Lying on your back with a pillow under your knees. Lifting   When lifting objects, keep your feet at least shoulder-width apart and tighten your abdominal muscles.  Bend your knees and hips and keep your spine neutral. It is important to lift using the strength  of your legs, not your back. Do not lock your knees straight out.  Always ask for help to lift heavy or awkward objects. This information is not intended to replace advice given to you by your health care provider. Make sure you discuss any questions you have with your health care provider. Document Released: 07/19/2005 Document Revised: 03/25/2016 Document Reviewed: 04/30/2015 Elsevier Interactive Patient Education  [employer].

## 2018-03-15 NOTE — Patient Instructions (Signed)
Access Code: [email or web address]  URL: [email or web address]  Date: 03/15/2018  Prepared by: Karie MainlandJennifer Paa   Exercises  Seated Hamstring Stretch - 10 reps - 2 sets - 30 hold - 1x daily - 7x weekly  Supine Hamstring Stretch with Strap - 3 reps - 1 sets - 30 hold - 1x daily - 7x weekly  Supine ITB Stretch with Strap - 3 reps - 1 sets - 30 hold - 1x daily - 7x weekly  Supine Active Straight Leg Raise - 10 reps - 2 sets - 5 hold - 1x daily - 7x weekly  Straight Leg Raise with External Rotation - 10 reps - 2 sets - 5 hold - 1x daily - 7x weekly

## 2018-03-16 DIAGNOSIS — Z3009 Encounter for other general counseling and advice on contraception: Secondary | ICD-10-CM | POA: Diagnosis not present

## 2018-03-16 DIAGNOSIS — Z0389 Encounter for observation for other suspected diseases and conditions ruled out: Secondary | ICD-10-CM | POA: Diagnosis not present

## 2018-03-16 DIAGNOSIS — Z3042 Encounter for surveillance of injectable contraceptive: Secondary | ICD-10-CM | POA: Diagnosis not present

## 2018-03-16 DIAGNOSIS — Z1388 Encounter for screening for disorder due to exposure to contaminants: Secondary | ICD-10-CM | POA: Diagnosis not present

## 2018-03-16 DIAGNOSIS — Z113 Encounter for screening for infections with a predominantly sexual mode of transmission: Secondary | ICD-10-CM | POA: Diagnosis not present

## 2018-03-16 DIAGNOSIS — Z3200 Encounter for pregnancy test, result unknown: Secondary | ICD-10-CM | POA: Diagnosis not present

## 2018-03-16 LAB — BASIC METABOLIC PANEL
BUN/Creatinine Ratio: 16 (ref 9–23)
BUN: 13 mg/dL (ref 6–20)
CALCIUM: 10.1 mg/dL (ref 8.7–10.2)
CO2: 22 mmol/L (ref 20–29)
CREATININE: 0.79 mg/dL (ref 0.57–1.00)
Chloride: 106 mmol/L (ref 96–106)
GFR calc Af Amer: 125 mL/min/{1.73_m2} (ref >59)
GFR, EST NON AFRICAN AMERICAN: 109 mL/min/{1.73_m2} (ref >59)
GLUCOSE: 82 mg/dL (ref 65–99)
Potassium: 5.1 mmol/L (ref 3.5–5.2)
SODIUM: 140 mmol/L (ref 134–144)

## 2018-03-16 LAB — RHEUMATOID FACTOR

## 2018-03-16 LAB — CBC
HEMATOCRIT: 38.5 % (ref 34.0–46.6)
Hemoglobin: 12.6 g/dL (ref 11.1–15.9)
MCH: 29.7 pg (ref 26.6–33.0)
MCHC: 32.7 g/dL (ref 31.5–35.7)
MCV: 91 fL (ref 79–97)
Platelets: 174 10*3/uL (ref 150–450)
RBC: 4.24 x10E6/uL (ref 3.77–5.28)
RDW: 14.2 % (ref 12.3–15.4)
WBC: 4.5 10*3/uL (ref 3.4–10.8)

## 2018-03-16 LAB — ANA: ANA: NEGATIVE

## 2018-03-16 LAB — SEDIMENTATION RATE: SED RATE: 2 mm/h (ref 0–32)

## 2018-03-16 NOTE — Therapy (Signed)
[facility] [address] [place], [place], [address] Phone: [phone]   Fax:  [phone]  Physical Therapy Evaluation  Patient Details  Name: Vanice Rappa MRN: [id number] Date of Birth: 07-Jun-2001 Referring Provider: Dr. Bertram Denver    Encounter Date: 03/15/2018  PT End of Session - 03/16/18 0914    Visit Number  1    Number of Visits  4    Date for PT Re-Evaluation  04/12/18    PT Start Time  1100    PT Stop Time  1145    PT Time Calculation (min)  45 min    Activity Tolerance  Patient tolerated treatment well    Behavior During Therapy  [facility] for tasks assessed/performed       History reviewed. No pertinent past medical history.  History reviewed. No pertinent surgical history.  There were no vitals filed for this visit.   Subjective Assessment - 03/15/18 1111    Subjective  Patient presents with chronic knee pain in Rt > Lt.  She has had pain in her knees all throughout high school. Feet both were swollen this AM and this happens regularly.  Pain did not improve over the years.  She was very active as a child.  Was told she has hypermobile knees.  She has difficulty standing too long, riding in the car, sitting too long stiffens as she drives.  She has difficulty working out (squatting, running).  She has diff staying asleep, leg "jumps"  as she is sleeping.  Her joint "gets tight", shooting pain down into leg.  HAs a knee brace but it causes swelling and feels numb and tingly.  Does not wear often, only if she is doing alot of walking.     Limitations  Lifting;Standing;Walking;Other (comment);Sitting    Diagnostic tests  XR     Patient Stated Goals  Patient would like to feeling confident with working on.      Currently in Pain?  Yes    Pain Score  4     Pain Location  Knee    Pain Orientation  Right    Pain Descriptors / Indicators  Tightness    Pain Type  Chronic pain    Pain Radiating Towards  shin     Pain  Onset  More than a month ago    Pain Frequency  Intermittent    Aggravating Factors   activity, work, stairs     Pain Relieving Factors  rest , ice, brace at times     Effect of Pain on Daily Activities  doesnt want to make it worse with working out ,          [facility] PT Assessment - 03/16/18 0001      Assessment   Medical Diagnosis  chronic bilateral knee pain     Referring Provider  Dr. Bertram Denver     Onset Date/Surgical Date  --   5 yrs    Prior Therapy  No       Precautions   Precautions  None      Restrictions   Weight Bearing Restrictions  No      Balance Screen   Has the patient fallen in the past 6 months  No      Home Environment   Living Environment  Private residence    Living Arrangements  Parent    Type of Home  House    Home Access  Stairs to enter    Entrance  Stairs-Number of Steps  5    Entrance Stairs-Rails  Right    Home Layout  One level      Prior Function   Level of Independence  Independent    Vocation  Part time employment;Student    Vocation Requirements  walking, standing.  Needs to walk stairs at school, will not be working during     Leisure  likes to workout, study, sleep      Cognition   Overall Cognitive Status  Within Functional Limits for tasks assessed      Observation/Other Assessments   Focus on Therapeutic Outcomes (FOTO)   NT due to MCD       Observation/Other Assessments-Edema    Edema  --   Rt. inferior to patella puffy, but measured each 13 inch      Sensation   Light Touch  Appears Intact      Coordination   Gross Motor Movements are Fluid and Coordinated  Not tested      Posture/Postural Control   Posture Comments  mild pes planus R >L , genu valgus, tibia IR       AROM   Right Knee Extension  4    Right Knee Flexion  140    Left Knee Extension  5    Left Knee Flexion  140      Strength   Right Hip Flexion  5/5    Left Hip Flexion  5/5    Right Knee Flexion  4+/5    Right Knee Extension  4+/5    Left  Knee Flexion  5/5    Left Knee Extension  5/5      Flexibility   Soft Tissue Assessment /Muscle Length  yes    Hamstrings  tight and tight gastroc     Quadriceps  tight     ITB  tight       Palpation   Patella mobility  painful R , normal     Palpation comment  TTP Rt patella and medial joint line       Special Tests   Other special tests  neg ligamentous test, but pain with anterior drawer     Meniscus Tests  McMurray Test      McMurray Test   Findings  Negative    Comments  bilat.                 Objective measurements completed on examination: See above findings.              PT Education - 03/16/18 0914    Education Details  PT/POC, HEP, LE alignment, RICE     Person(s) Educated  Patient    Methods  Explanation;Handout    Comprehension  Verbalized understanding;Need further instruction       PT Short Term Goals - 03/16/18 0915      PT SHORT TERM GOAL #1   Title  Pt will be I with initial HEP for strength, ROM, stability     Baseline  given on eval     Time  4    Period  Weeks    Status  New    Target Date  04/12/18      PT SHORT TERM GOAL #2   Title  Pt will be able to report less stiffness after sitting 30-45 min for class, meals     Baseline  mod to severe after 30 min     Time  4    Period  Weeks    Status  New    Target Date  04/12/18      PT SHORT TERM GOAL #3   Title  Pt will be able to improve her ability to walk across campus, pain <3 /10 with short distances     Baseline  pain moderate with walking, standing over 30 min     Time  4    Period  Weeks    Status  New    Target Date  04/12/18        PT Long Term Goals - 03/16/18 0919      PT LONG TERM GOAL #1   Title  TBA if progressing              Plan - 03/16/18 0920    Clinical Impression Statement  Patient presents for low complexity eval of chronic, bilateral and progressive knee pain. She has been given exercises in the past but it is unclear if she was  consistent with this.  She has had this pain >5 yrs.  She shows mild jt. hypermobility, min swelling , min crepitus.  She also has bilateral foot swelling and pain at times.  She had bloodwork yesterday to assess for other factors.  She has decent strength and ROM buthas more sof tissue restriction on Rt side (including ankle) than the Lt.  She will benefit from PT to positively impact her mobility as she begins school this fall.     History and Personal Factors relevant to plan of care:  --    Clinical Presentation  Stable    Clinical Decision Making  Low    Rehab Potential  Excellent    PT Frequency  1x / week    PT Duration  4 weeks   then 2 x 6 if progressing    PT Treatment/Interventions  ADLs/Self Care Home Management;Electrical Stimulation;Functional mobility training;Neuromuscular re-education;Taping;Manual techniques;Patient/family education;Therapeutic exercise;Balance training;Moist Heat;Ultrasound;Cryotherapy;Iontophoresis 4mg /ml Dexamethasone;Passive range of motion;Therapeutic activities    PT Next Visit Plan  check HEP, try tape, check hip ABd and ER strength     PT Home Exercise Plan  knee level 1: SLR , hamstring and ITB     Recommended Other Services       Consulted and Agree with Plan of Care  Patient       Patient will benefit from skilled therapeutic intervention in order to improve the following deficits and impairments:  Decreased mobility, Hypermobility, Increased fascial restricitons, Impaired flexibility, Impaired UE functional use, Postural dysfunction, Pain, Decreased strength  Visit Diagnosis: Chronic pain of right knee  Chronic pain of left knee  Localized edema  Difficulty in walking, not elsewhere classified     Problem List Patient Active Problem List   Diagnosis Date Noted  . Patellofemoral pain syndrome of both knees 09/23/2017  . Hypermobility syndrome 09/23/2017    Marcella Charlson 03/16/2018, 1:20 PM  [facility]  [address] [place], [place], [address] Phone: [phone]   Fax:  [phone]  Name: Andre LefortKhadara Formisano MRN: [id number] Date of Birth: May 30, 2001   Karie MainlandJennifer Edras Wilford, PT 03/16/18 1:20 PM Phone: [phone] Fax: [phone]

## 2018-03-16 NOTE — Addendum Note (Signed)
Addended by: Karie MainlandPAA, JENNIFER L on: 03/16/2018 01:24 PM   Modules accepted: Orders

## 2018-03-23 ENCOUNTER — Telehealth: Payer: Self-pay

## 2018-03-23 NOTE — Telephone Encounter (Signed)
-----   Message from Claiborne RiggZelda W Fleming, NP sent at 03/23/2018 10:36 AM EDT ----- All of your labs are normal. There is no indication of any inflammatory of autoimmune diseases.

## 2018-03-23 NOTE — Telephone Encounter (Signed)
CMA attempt to call patient to inform on lab results.  No answer and left a VM for patient.  If patient call back, please inform:   All of your labs are normal. There is no indication of any inflammatory of autoimmune diseases.

## 2018-04-20 DIAGNOSIS — Z113 Encounter for screening for infections with a predominantly sexual mode of transmission: Secondary | ICD-10-CM | POA: Diagnosis not present

## 2018-04-20 DIAGNOSIS — N9489 Other specified conditions associated with female genital organs and menstrual cycle: Secondary | ICD-10-CM | POA: Diagnosis not present

## 2018-08-25 ENCOUNTER — Encounter (HOSPITAL_COMMUNITY): Payer: Self-pay | Admitting: Emergency Medicine

## 2018-08-25 ENCOUNTER — Emergency Department (HOSPITAL_COMMUNITY)
Admission: EM | Admit: 2018-08-25 | Discharge: 2018-08-25 | Disposition: A | Discharge status: Home / Self Care | Payer: Medicaid Other | Attending: Emergency Medicine | Admitting: Emergency Medicine

## 2018-08-25 DIAGNOSIS — M545 Low back pain, unspecified: Secondary | ICD-10-CM

## 2018-08-25 MED ORDER — IBUPROFEN 600 MG PO TABS: 600.0000 mg | ORAL_TABLET | Freq: Four times a day (QID) | ORAL | 0 refills | Status: DC | PRN

## 2018-08-25 MED ORDER — METHOCARBAMOL 500 MG PO TABS: 500.0000 mg | ORAL_TABLET | Freq: Two times a day (BID) | ORAL | 0 refills | Status: DC

## 2018-08-25 NOTE — Discharge Instructions (Signed)
Return if any problems.  See your Physician for recheck,  °

## 2018-08-25 NOTE — ED Notes (Signed)
Patient verbalizes understanding of discharge instructions. Opportunity for questioning and answers were provided. Armband removed by staff, pt discharged from ED.  

## 2018-08-25 NOTE — ED Triage Notes (Signed)
Patient c/o lower back pain with radiation to both legs - states she used to run track and long jump and feels a previous jump may have messed her back up. Ambulatory with steady gait. Endorsing some tingling pains to both legs while at work yesterday.

## 2018-08-25 NOTE — ED Provider Notes (Signed)
[facility] EMERGENCY DEPARTMENT Provider Note   CSN: [id number] Arrival date & time: 08/25/18  2505     History   Chief Complaint Chief Complaint  Patient presents with  . Back Pain    HPI Mary Glover is a 18 y.o. female.  The history is provided by the patient. No language interpreter was used.  Back Pain  Location:  Lumbar spine Quality:  Aching Pain severity:  Moderate Onset quality:  Gradual Timing:  Constant Chronicity:  New Relieved by:  Nothing Worsened by:  Nothing Ineffective treatments:  None tried Pt reports she has had back pain since middle school    History reviewed. No pertinent past medical history.  Patient Active Problem List   Diagnosis Date Noted  . Patellofemoral pain syndrome of both knees 09/23/2017  . Hypermobility syndrome 09/23/2017    History reviewed. No pertinent surgical history.   OB History   No obstetric history on file.      Home Medications    Prior to Admission medications   Not on File    Family History Family History  Problem Relation Age of Onset  . Diabetes Mother   . Hypertension Father     Social History Social History   Tobacco Use  . Smoking status: Never Smoker  . Smokeless tobacco: Never Used  Substance Use Topics  . Alcohol use: No  . Drug use: No     Allergies   Patient has no known allergies.   Review of Systems Review of Systems  Musculoskeletal: Positive for back pain.  All other systems reviewed and are negative.    Physical Exam Updated Vital Signs BP 122/76 (BP Location: Left Arm)   Pulse 85   Temp 99.4 F (37.4 C) (Oral)   SpO2 100%   Physical Exam Vitals signs reviewed.  HENT:     Head: Normocephalic.     Right Ear: External ear normal.     Left Ear: External ear normal.     Nose: Nose normal.  Eyes:     Pupils: Pupils are equal, round, and reactive to light.  Neck:     Musculoskeletal: Normal range of motion.  Cardiovascular:     Rate  and Rhythm: Normal rate.  Pulmonary:     Effort: Pulmonary effort is normal.  Abdominal:     General: Abdomen is flat.  Musculoskeletal: Normal range of motion.     Comments: diffusely tender thoracic and lumbar areas  Neurological:     General: No focal deficit present.     Mental Status: She is alert.  Psychiatric:        Mood and Affect: Mood normal.      ED Treatments / Results  Labs (all labs ordered are listed, but only abnormal results are displayed) Labs Reviewed - No data to display  EKG None  Radiology No results found.  Procedures Procedures (including critical care time)  Medications Ordered in ED Medications - No data to display   Initial Impression / Assessment and Plan / ED Course  I have reviewed the triage vital signs and the nursing notes.  Pertinent labs & imaging results that were available during my care of the patient were reviewed by me and considered in my medical decision making (see chart for details).     MDM  Pt advised to follow up with her MD for recheck.   Final Clinical Impressions(s) / ED Diagnoses   Final diagnoses:  Low back pain without sciatica, unspecified  back pain laterality, unspecified chronicity    ED Discharge Orders         Ordered    ibuprofen (ADVIL,MOTRIN) 600 MG tablet  Every 6 hours PRN     08/25/18 1103    methocarbamol (ROBAXIN) 500 MG tablet  2 times daily     08/25/18 [address] [place] 08/25/18 1114    Cathren Laine, MD 08/26/18 [phone]

## 2018-09-06 NOTE — Progress Notes (Signed)
Patient ID: Mary Glover, female   DOB: May 23, 2001, 18 y.o.   MRN: [id number]      Mary Glover, is a 18 y.o. female  KDT:[id number]  DXI:[id number]  DOB - 01-Sep-2000  Subjective:  Chief Complaint and HPI: Mary Glover is a 18 y.o. female here  for a follow up visit After being seen in the ED 08/25/2018 for LBP and treated with Ibuprofen and methocarbamol and advised to f/up here. This is a chronic and ongoing problem for several years.  She has seen ortho and has done PT in the past which helped some.  She has occasional paresthesias B shins.   Occasionally has weakness too. The meds help some but make her sleepy.  The pain is moderate in intensity.  Joints often feels stiff.  No tick bites.  No fevers.  Labs have been done in the past.  Feet swell at times.  Ran track in high school.  Had a couple of injuries back then.  Also has B knee pain.  Appears to have been diagnosed previously with hypermobility syndrome.    ED/Hospital notes reviewed.   Family history:  No joint disease/rheumatological issues.    ROS:   Constitutional:  No f/c, No night sweats, No unexplained weight loss. EENT:  No vision changes, No blurry vision, No hearing changes. No mouth, throat, or ear problems.  Respiratory: No cough, No SOB Cardiac: No CP, no palpitations GI:  No abd pain, No N/V/D. GU: No Urinary s/sx Musculoskeletal: + back, B knee pain Neuro: No headache, no dizziness, no motor weakness.  Skin: No rash Endocrine:  No polydipsia. No polyuria.  Psych: Denies SI/HI  No problems updated.  ALLERGIES: No Known Allergies  PAST MEDICAL HISTORY: History reviewed. No pertinent past medical history.  MEDICATIONS AT HOME: Prior to Admission medications   Medication Sig Start Date End Date Taking? Authorizing Provider  ibuprofen (ADVIL,MOTRIN) 600 MG tablet Take 1 tablet (600 mg total) by mouth every 6 (six) hours as needed. 08/25/18  Yes Cheron Schaumann K, PA-C  methocarbamol (ROBAXIN) 500  MG tablet Take 1 tablet (500 mg total) by mouth 2 (two) times daily. 08/25/18  Yes Cheron Schaumann K, PA-C     Objective:  EXAM:   Vitals:   09/07/18 0859  BP: 106/73  Pulse: 93  Temp: 98.9 F (37.2 C)  TempSrc: Oral  SpO2: 96%  Weight: 119 lb (54 kg)  Height: 5\' 3"  (1.6 m)    General appearance : A&OX3. NAD. Non-toxic-appearing HEENT: Atraumatic and Normocephalic.  PERRLA. EOM intact.  Neck: supple, no JVD. No cervical lymphadenopathy. No thyromegaly Chest/Lungs:  Breathing-non-labored, Good air entry bilaterally, breath sounds normal without rales, rhonchi, or wheezing  CVS: S1 S2 regular, no murmurs, gallops, rubs  Spine-no abnormal curvature.  No spiny TTP.  Some paraspinus muscle spasm.  Neg SLRB.   B knee exam without ligament instability.  There is some laxity of the R patella that is mild.   Extremities: Bilateral Lower Ext shows no edema, both legs are warm to touch with = pulse throughout Neurology:  CN II-XII grossly intact, Non focal.   Psych:  TP linear. J/I WNL. Normal speech. Appropriate eye contact and affect.  Skin:  No Rash  Data Review No results found for: HGBA1C   Assessment & Plan   1. Chronic bilateral low back pain without sciatica No red flags-labs reviewed, use the meds prescribed - Vitamin D, 25-hydroxy - Ambulatory referral to Orthopedic Surgery  2. Chronic pain of both  knees - Vitamin D, 25-hydroxy - Ambulatory referral to Orthopedic Surgery  3. Encounter for examination following treatment at hospital     Patient have been counseled extensively about nutrition and exercise  Return in about 2 months (around 11/06/2018), or follow-up PCP with knee and back pain.  The patient was given clear instructions to go to ER or return to medical center if symptoms don't improve, worsen or new problems develop. The patient verbalized understanding. The patient was told to call to get lab results if they haven't heard anything in the next week.      Georgian CoAngela McClung, PA-C [facility] [place], [place] [phone]   09/07/2018, 9:18 AM

## 2018-09-07 ENCOUNTER — Ambulatory Visit: Payer: Medicaid Other | Attending: Nurse Practitioner | Admitting: Physician Assistant

## 2018-09-07 VITALS — BP 106/73 | HR 93 | Temp 98.9°F | Ht 63.0 in | Wt 119.0 lb

## 2018-09-07 DIAGNOSIS — G8929 Other chronic pain: Secondary | ICD-10-CM

## 2018-09-07 DIAGNOSIS — M25561 Pain in right knee: Secondary | ICD-10-CM | POA: Diagnosis not present

## 2018-09-07 DIAGNOSIS — Z79899 Other long term (current) drug therapy: Secondary | ICD-10-CM | POA: Diagnosis not present

## 2018-09-07 DIAGNOSIS — M357 Hypermobility syndrome: Secondary | ICD-10-CM | POA: Insufficient documentation

## 2018-09-07 DIAGNOSIS — Z09 Encounter for follow-up examination after completed treatment for conditions other than malignant neoplasm: Secondary | ICD-10-CM | POA: Diagnosis not present

## 2018-09-07 DIAGNOSIS — M545 Low back pain: Secondary | ICD-10-CM

## 2018-09-07 DIAGNOSIS — M25562 Pain in left knee: Secondary | ICD-10-CM

## 2018-09-07 NOTE — Progress Notes (Signed)
Patient is having pain in knees and back.

## 2018-09-07 NOTE — Patient Instructions (Signed)
Acute Back Pain, Adult  Acute back pain is sudden and usually short-lived. It is often caused by an injury to the muscles and tissues in the back. The injury may result from:   A muscle or ligament getting overstretched or torn (strained). Ligaments are tissues that connect bones to each other. Lifting something improperly can cause a back strain.   Wear and tear (degeneration) of the spinal disks. Spinal disks are circular tissue that provides cushioning between the bones of the spine (vertebrae).   Twisting motions, such as while playing sports or doing yard work.   A hit to the back.   Arthritis.  You may have a physical exam, lab tests, and imaging tests to find the cause of your pain. Acute back pain usually goes away with rest and home care.  Follow these instructions at home:  Managing pain, stiffness, and swelling   Take over-the-counter and prescription medicines only as told by your health care provider.   Your health care provider may recommend applying ice during the first 24-48 hours after your pain starts. To do this:  ? Put ice in a plastic bag.  ? Place a towel between your skin and the bag.  ? Leave the ice on for 20 minutes, 2-3 times a day.   If directed, apply heat to the affected area as often as told by your health care provider. Use the heat source that your health care provider recommends, such as a moist heat pack or a heating pad.  ? Place a towel between your skin and the heat source.  ? Leave the heat on for 20-30 minutes.  ? Remove the heat if your skin turns bright red. This is especially important if you are unable to feel pain, heat, or cold. You have a greater risk of getting burned.  Activity     Do not stay in bed. Staying in bed for more than 1-2 days can delay your recovery.   Sit up and stand up straight. Avoid leaning forward when you sit, or hunching over when you stand.  ? If you work at a desk, sit close to it so you do not need to lean over. Keep your chin tucked  in. Keep your neck drawn back, and keep your elbows bent at a right angle. Your arms should look like the letter "L."  ? Sit high and close to the steering wheel when you drive. Add lower back (lumbar) support to your car seat, if needed.   Take short walks on even surfaces as soon as you are able. Try to increase the length of time you walk each day.   Do not sit, drive, or stand in one place for more than 30 minutes at a time. Sitting or standing for long periods of time can put stress on your back.   Do not drive or use heavy machinery while taking prescription pain medicine.   Use proper lifting techniques. When you bend and lift, use positions that put less stress on your back:  ? Bend your knees.  ? Keep the load close to your body.  ? Avoid twisting.   Exercise regularly as told by your health care provider. Exercising helps your back heal faster and helps prevent back injuries by keeping muscles strong and flexible.   Work with a physical therapist to make a safe exercise program, as recommended by your health care provider. Do any exercises as told by your physical therapist.  Lifestyle   Maintain   a healthy weight. Extra weight puts stress on your back and makes it difficult to have good posture.   Avoid activities or situations that make you feel anxious or stressed. Stress and anxiety increase muscle tension and can make back pain worse. Learn ways to manage anxiety and stress, such as through exercise.  General instructions   Sleep on a firm mattress in a comfortable position. Try lying on your side with your knees slightly bent. If you lie on your back, put a pillow under your knees.   Follow your treatment plan as told by your health care provider. This may include:  ? Cognitive or behavioral therapy.  ? Acupuncture or massage therapy.  ? Meditation or yoga.  Contact a health care provider if:   You have pain that is not relieved with rest or medicine.   You have increasing pain going down  into your legs or buttocks.   Your pain does not improve after 2 weeks.   You have pain at night.   You lose weight without trying.   You have a fever or chills.  Get help right away if:   You develop new bowel or bladder control problems.   You have unusual weakness or numbness in your arms or legs.   You develop nausea or vomiting.   You develop abdominal pain.   You feel faint.  Summary   Acute back pain is sudden and usually short-lived.   Use proper lifting techniques. When you bend and lift, use positions that put less stress on your back.   Take over-the-counter and prescription medicines and apply heat or ice as directed by your health care provider.  This information is not intended to replace advice given to you by your health care provider. Make sure you discuss any questions you have with your health care provider.  Document Released: 07/19/2005 Document Revised: 02/23/2018 Document Reviewed: 03/02/2017  Elsevier Interactive Patient Education  2019 [employer]

## 2018-09-08 ENCOUNTER — Telehealth: Payer: Self-pay

## 2018-09-08 ENCOUNTER — Other Ambulatory Visit: Payer: Self-pay | Admitting: Physician Assistant

## 2018-09-08 LAB — VITAMIN D 25 HYDROXY (VIT D DEFICIENCY, FRACTURES): Vit D, 25-Hydroxy: 8.1 ng/mL — ABNORMAL LOW (ref 30.0–100.0)

## 2018-09-08 MED ORDER — VITAMIN D (ERGOCALCIFEROL) 1.25 MG (50000 UNIT) PO CAPS: 50000.0000 [IU] | ORAL_CAPSULE | ORAL | 0 refills | Status: DC

## 2018-09-08 MED FILL — VIT D2 1.25 MG (50,000 UNIT: 1.25 MG | 84 days supply | Qty: 12 | Fill #0

## 2018-09-08 NOTE — Telephone Encounter (Signed)
Patient was called and operator states that call can not be completed at this time.

## 2018-09-08 NOTE — Telephone Encounter (Signed)
-----   Message from Anders Simmonds, [place] sent at 09/08/2018  9:13 AM EST ----- Please call patient and let them that their vitamin D is VERY VERY low.  This can contribute to muscle aches, anxiety, fatigue, and depression.  I have sent a prescription to the pharmacy for them to take once a week.  It is very important that she start this prescription at her earliest convenience.  We will recheck this level in 3-4 months.  Thanks, Georgian Co, PA-C

## 2018-09-13 ENCOUNTER — Ambulatory Visit: Payer: Medicaid Other | Attending: Nurse Practitioner | Admitting: Physician Assistant

## 2018-09-13 ENCOUNTER — Other Ambulatory Visit (HOSPITAL_COMMUNITY)
Admission: RE | Admit: 2018-09-13 | Discharge: 2018-09-13 | Disposition: A | Discharge status: Home / Self Care | Payer: Medicaid Other | Source: Ambulatory Visit | Attending: Nurse Practitioner | Admitting: Nurse Practitioner

## 2018-09-13 VITALS — BP 117/82 | HR 73 | Temp 98.9°F | Resp 18 | Ht 63.0 in | Wt 123.0 lb

## 2018-09-13 DIAGNOSIS — Z3202 Encounter for pregnancy test, result negative: Secondary | ICD-10-CM | POA: Diagnosis not present

## 2018-09-13 DIAGNOSIS — N912 Amenorrhea, unspecified: Secondary | ICD-10-CM | POA: Insufficient documentation

## 2018-09-13 DIAGNOSIS — L709 Acne, unspecified: Secondary | ICD-10-CM | POA: Diagnosis not present

## 2018-09-13 DIAGNOSIS — L299 Pruritus, unspecified: Secondary | ICD-10-CM | POA: Insufficient documentation

## 2018-09-13 LAB — POCT URINE PREGNANCY: PREG TEST UR: NEGATIVE

## 2018-09-13 MED ORDER — MEDROXYPROGESTERONE ACETATE 150 MG/ML IM SUSP
150.0000 mg | Freq: Once | INTRAMUSCULAR | Status: AC
  Administered 2018-09-13: 150 mg via INTRAMUSCULAR

## 2018-09-13 MED ORDER — CETIRIZINE HCL 10 MG PO TABS: 10.0000 mg | ORAL_TABLET | Freq: Every day | ORAL | 11 refills | Status: DC

## 2018-09-13 NOTE — Progress Notes (Signed)
Patient ID: Mary Glover, female   DOB: 09/13/2000, 18 y.o.   MRN: [id number]   Mary Glover, is a 18 y.o. female  [id number]  [id number]  DOB - 2001/04/11  Subjective:  Chief Complaint and HPI: Mary Glover is a 18 y.o. female here for ~6 week h/o increased facial acne that itches.  Last Depo Provera was 06/2018.  No menstrual cycle.  She was thinking about going off Depo but now has decided to continue it.  No new soaps/detergents/face products.  ROS:   Constitutional:  No f/c, No night sweats, No unexplained weight loss. EENT:  No vision changes, No blurry vision, No hearing changes. No mouth, throat, or ear problems.  Respiratory: No cough, No SOB Cardiac: No CP, no palpitations GI:  No abd pain, No N/V/D. GU: No Urinary s/sx Musculoskeletal: No joint pain Neuro: No headache, no dizziness, no motor weakness.  Skin: mild acne on face.  No other visible rash. Endocrine:  No polydipsia. No polyuria.  Psych: Denies SI/HI  No problems updated.  ALLERGIES: No Known Allergies  PAST MEDICAL HISTORY: No past medical history on file.  MEDICATIONS AT HOME: Prior to Admission medications   Medication Sig Start Date End Date Taking? Authorizing Provider  cetirizine (ZYRTEC) 10 MG tablet Take 1 tablet (10 mg total) by mouth daily. 09/13/18   Anders Simmonds, PA-C  ibuprofen (ADVIL,MOTRIN) 600 MG tablet Take 1 tablet (600 mg total) by mouth every 6 (six) hours as needed. 08/25/18   Elson Areas, PA-C  methocarbamol (ROBAXIN) 500 MG tablet Take 1 tablet (500 mg total) by mouth 2 (two) times daily. 08/25/18   Elson Areas, PA-C  Vitamin D, Ergocalciferol, (DRISDOL) 1.25 MG (50000 UT) CAPS capsule Take 1 capsule (50,000 Units total) by mouth every 7 (seven) days. 09/08/18   Anders Simmonds, PA-C     Objective:  EXAM:   Vitals:   09/13/18 1517  BP: 117/82  Pulse: 73  Resp: 18  Temp: 98.9 F (37.2 C)  TempSrc: Oral  SpO2: 100%  Weight: 123 lb (55.8 kg)    Height: 5\' 3"  (1.6 m)    General appearance : A&OX3. NAD. Non-toxic-appearing HEENT: Atraumatic and Normocephalic.  PERRLA. EOM intact.  Neck: supple, no JVD. No cervical lymphadenopathy. No thyromegaly Chest/Lungs:  Breathing-non-labored, Good air entry bilaterally, breath sounds normal without rales, rhonchi, or wheezing  CVS: S1 S2 regular, no murmurs, gallops, rubs  Extremities: Bilateral Lower Ext shows no edema, both legs are warm to touch with = pulse throughout Neurology:  CN II-XII grossly intact, Non focal.   Psych:  TP linear. J/I WNL. Normal speech. Appropriate eye contact and affect.  Skin:  Mild acnes on face with no other rash  Data Review No results found for: HGBA1C   Assessment & Plan   1. Amenorrhea due to Depo Provera - POCT urine pregnancy  2. Itching - cetirizine (ZYRTEC) 10 MG tablet; Take 1 tablet (10 mg total) by mouth daily.  Dispense: 30 tablet; Refill: 11 - Ambulatory referral to Dermatology  3. Acne, unspecified acne type - Ambulatory referral to Dermatology   Patient have been counseled extensively about nutrition and exercise  Return for 11/06/2018 appt wth Zelda.  The patient was given clear instructions to go to ER or return to medical center if symptoms don't improve, worsen or new problems develop. The patient verbalized understanding. The patient was told to call to get lab results if they haven't heard anything in the next week.  Georgian Co, PA-C [facility] and [facility] [place], [place] [phone]   09/13/2018, 3:43 PM

## 2018-09-13 NOTE — Progress Notes (Signed)
Patient states she has not had a period in 2 years. Patient was on Depo for 2 years. Last injection received 03/2018. Next injection was scheduled for 06/2018. Patient did not receive injection and patient has not seen her period. PT will be completed.

## 2018-09-19 ENCOUNTER — Other Ambulatory Visit: Payer: Self-pay | Admitting: Physician Assistant

## 2018-09-19 ENCOUNTER — Telehealth: Payer: Self-pay | Admitting: *Deleted

## 2018-09-19 LAB — URINE CYTOLOGY ANCILLARY ONLY: CANDIDA VAGINITIS: NEGATIVE

## 2018-09-19 MED ORDER — METRONIDAZOLE 500 MG PO TABS: 500.0000 mg | ORAL_TABLET | Freq: Two times a day (BID) | ORAL | 0 refills | Status: DC

## 2018-09-19 NOTE — Telephone Encounter (Signed)
MA unable to reach patient.

## 2018-09-19 NOTE — Telephone Encounter (Signed)
-----   Message from Anders Simmonds, [place] sent at 09/19/2018 12:16 PM EST ----- Please call patient.  She was positive for BV.  No yeast or STD.  I sent her a prescription to our pharmacy.  Follow-up as planned.  Thanks, Georgian Co, PA-C

## 2018-09-21 LAB — URINE CYTOLOGY ANCILLARY ONLY
Chlamydia: NEGATIVE
Neisseria Gonorrhea: NEGATIVE
Trichomonas: NEGATIVE

## 2018-10-05 ENCOUNTER — Ambulatory Visit (INDEPENDENT_AMBULATORY_CARE_PROVIDER_SITE_OTHER): Payer: Self-pay | Admitting: Family Medicine

## 2018-10-23 ENCOUNTER — Emergency Department (HOSPITAL_COMMUNITY)
Admission: EM | Admit: 2018-10-23 | Discharge: 2018-10-24 | Disposition: A | Discharge status: Home / Self Care | Payer: No Typology Code available for payment source | Attending: Emergency Medicine | Admitting: Emergency Medicine

## 2018-10-23 ENCOUNTER — Encounter (HOSPITAL_COMMUNITY): Payer: Self-pay | Admitting: Emergency Medicine

## 2018-10-23 ENCOUNTER — Other Ambulatory Visit: Payer: Self-pay

## 2018-10-23 DIAGNOSIS — S8391XA Sprain of unspecified site of right knee, initial encounter: Secondary | ICD-10-CM | POA: Diagnosis not present

## 2018-10-23 DIAGNOSIS — S3992XA Unspecified injury of lower back, initial encounter: Secondary | ICD-10-CM | POA: Diagnosis not present

## 2018-10-23 DIAGNOSIS — S39012A Strain of muscle, fascia and tendon of lower back, initial encounter: Secondary | ICD-10-CM | POA: Diagnosis not present

## 2018-10-23 DIAGNOSIS — R55 Syncope and collapse: Secondary | ICD-10-CM | POA: Insufficient documentation

## 2018-10-23 DIAGNOSIS — Z79899 Other long term (current) drug therapy: Secondary | ICD-10-CM | POA: Insufficient documentation

## 2018-10-23 DIAGNOSIS — S8991XA Unspecified injury of right lower leg, initial encounter: Secondary | ICD-10-CM | POA: Diagnosis not present

## 2018-10-23 DIAGNOSIS — R51 Headache: Secondary | ICD-10-CM | POA: Diagnosis not present

## 2018-10-23 DIAGNOSIS — M545 Low back pain: Secondary | ICD-10-CM | POA: Diagnosis not present

## 2018-10-23 DIAGNOSIS — Y92410 Unspecified street and highway as the place of occurrence of the external cause: Secondary | ICD-10-CM | POA: Diagnosis not present

## 2018-10-23 DIAGNOSIS — M25561 Pain in right knee: Secondary | ICD-10-CM | POA: Diagnosis not present

## 2018-10-23 DIAGNOSIS — R52 Pain, unspecified: Secondary | ICD-10-CM | POA: Diagnosis not present

## 2018-10-23 DIAGNOSIS — Y999 Unspecified external cause status: Secondary | ICD-10-CM | POA: Diagnosis not present

## 2018-10-23 DIAGNOSIS — Y9389 Activity, other specified: Secondary | ICD-10-CM | POA: Diagnosis not present
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
## 2018-10-24 ENCOUNTER — Emergency Department (HOSPITAL_COMMUNITY): Payer: No Typology Code available for payment source

## 2018-10-24 DIAGNOSIS — S3992XA Unspecified injury of lower back, initial encounter: Secondary | ICD-10-CM | POA: Diagnosis not present

## 2018-10-24 DIAGNOSIS — S39012A Strain of muscle, fascia and tendon of lower back, initial encounter: Secondary | ICD-10-CM | POA: Diagnosis not present

## 2018-10-24 DIAGNOSIS — M25561 Pain in right knee: Secondary | ICD-10-CM | POA: Diagnosis not present

## 2018-10-24 DIAGNOSIS — S8991XA Unspecified injury of right lower leg, initial encounter: Secondary | ICD-10-CM | POA: Diagnosis not present

## 2018-10-24 LAB — POC URINE PREG, ED: Preg Test, Ur: NEGATIVE
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
## 2018-10-24 NOTE — ED Notes (Signed)
Patient verbalizes understanding of discharge instructions. Opportunity for questioning and answers were provided. Armband removed by staff, pt discharged from ED ambulatory.   

## 2018-10-24 NOTE — ED Notes (Signed)
Patient father Mary Glover calling would like a call back, stating he is wanting an update on patient  Please call  [phone]

## 2018-11-06 ENCOUNTER — Ambulatory Visit: Payer: Medicaid Other | Attending: Nurse Practitioner | Admitting: Nurse Practitioner

## 2018-11-06 ENCOUNTER — Other Ambulatory Visit: Payer: Self-pay

## 2018-11-06 MED ORDER — VITAMIN D (ERGOCALCIFEROL) 1.25 MG (50000 UNIT) PO CAPS: 50000.0000 [IU] | ORAL_CAPSULE | ORAL | 1 refills | Status: AC

## 2018-11-06 MED ORDER — METRONIDAZOLE 500 MG PO TABS: 500.0000 mg | ORAL_TABLET | Freq: Two times a day (BID) | ORAL | 0 refills | Status: AC

## 2018-11-06 NOTE — Progress Notes (Signed)
   Assessment & Plan:  There are no diagnoses linked to this encounter.  Patient has been counseled on age-appropriate routine health concerns for screening and prevention. These are reviewed and up-to-date. Referrals have been placed accordingly. Immunizations are up-to-date or declined.    Subjective:  No chief complaint on file.  HPI Mary Glover 18 y.o. female presents to office today   ROS  No past medical history on file.  No past surgical history on file.  Family History  Problem Relation Age of Onset  . Diabetes Mother   . Hypertension Father     Social History Reviewed with no changes to be made today.   Outpatient Medications Prior to Visit  Medication Sig Dispense Refill  . cetirizine (ZYRTEC) 10 MG tablet Take 1 tablet (10 mg total) by mouth daily. (Patient not taking: Reported on 10/24/2018) 30 tablet 11  . ibuprofen (ADVIL,MOTRIN) 600 MG tablet Take 1 tablet (600 mg total) by mouth every 6 (six) hours as needed. 30 tablet 0  . Vitamin D, Ergocalciferol, (DRISDOL) 1.25 MG (50000 UT) CAPS capsule Take 1 capsule (50,000 Units total) by mouth every 7 (seven) days. (Patient taking differently: Take 50,000 Units by mouth every Friday. ) 16 capsule 0   No facility-administered medications prior to visit.     No Known Allergies     Objective:    There were no vitals taken for this visit. Wt Readings from Last 3 Encounters:  09/13/18 123 lb (55.8 kg)  09/07/18 119 lb (54 kg)  03/15/18 122 lb 9.6 oz (55.6 kg) (40 %, Z= -0.25)*   * Growth percentiles are based on CDC (Girls, 2-20 Years) data.    Physical Exam       Patient has been counseled extensively about nutrition and exercise as well as the importance of adherence with medications and regular follow-up. The patient was given clear instructions to go to ER or return to medical center if symptoms don't improve, worsen or new problems develop. The patient verbalized understanding.   Follow-up: No  follow-ups on file.   Claiborne Rigg, FNP-BC [facility] and [facility] [place], [place] [phone]   11/06/2018, 12:14 PM This encounter was created in error - please disregard.

## 2018-12-06 ENCOUNTER — Other Ambulatory Visit: Payer: Self-pay

## 2018-12-06 ENCOUNTER — Ambulatory Visit: Payer: No Typology Code available for payment source | Admitting: Physician Assistant

## 2018-12-06 VITALS — BP 118/83 | HR 64 | Temp 97.9°F | Resp 16 | Ht 62.0 in | Wt 107.8 lb

## 2018-12-06 DIAGNOSIS — M654 Radial styloid tenosynovitis [de Quervain]: Secondary | ICD-10-CM

## 2018-12-06 DIAGNOSIS — Z3042 Encounter for surveillance of injectable contraceptive: Secondary | ICD-10-CM

## 2018-12-06 DIAGNOSIS — Z309 Encounter for contraceptive management, unspecified: Secondary | ICD-10-CM

## 2018-12-06 DIAGNOSIS — Z0000 Encounter for general adult medical examination without abnormal findings: Secondary | ICD-10-CM

## 2018-12-06 LAB — POCT URINE PREGNANCY: Preg Test, Ur: NEGATIVE

## 2018-12-06 MED ORDER — IBUPROFEN 600 MG PO TABS: 600.0000 mg | ORAL_TABLET | Freq: Three times a day (TID) | ORAL | 0 refills | Status: DC

## 2018-12-06 MED ORDER — MEDROXYPROGESTERONE ACETATE 150 MG/ML IM SUSP
150.0000 mg | Freq: Once | INTRAMUSCULAR | Status: AC
  Administered 2018-12-06: 150 mg via INTRAMUSCULAR

## 2018-12-06 NOTE — Progress Notes (Signed)
Patient ID: Mary Glover, female   DOB: 10-28-2000, 18 y.o.   MRN: [id number]   Mary Glover, is a 18 y.o. female  [id number]  [id number]:[id number]  DOB - 09-21-2000  Subjective:  Chief Complaint and HPI: Mary Glover is a 18 y.o. female here today for R thumb and wrist pain.  She is  R hand dominant.  She does a lot of repetitive work with her R hand.  She is having some numbness at times.  Not taking anything for pain. NKI.     ROS:   Constitutional:  No f/c, No night sweats, No unexplained weight loss. EENT:  No vision changes, No blurry vision, No hearing changes. No mouth, throat, or ear problems.  Respiratory: No cough, No SOB Cardiac: No CP, no palpitations GI:  No abd pain, No N/V/D. GU: No Urinary s/sx Musculoskeletal: R hand pain Neuro: No headache, no dizziness, no motor weakness.  Skin: No rash Endocrine:  No polydipsia. No polyuria.  Psych: Denies SI/HI  No problems updated.  ALLERGIES: Not on File  PAST MEDICAL HISTORY: No past medical history on file.  MEDICATIONS AT HOME: Prior to Admission medications   Medication Sig Start Date End Date Taking? Authorizing Provider  cetirizine (ZYRTEC) 10 MG tablet Take 1 tablet (10 mg total) by mouth daily. 09/13/18  Yes Anders Simmonds, PA-C  Vitamin D, Ergocalciferol, (DRISDOL) 1.25 MG (50000 UT) CAPS capsule Take 1 capsule (50,000 Units total) by mouth every 7 (seven) days for 90 doses. 11/06/18 07/22/20 Yes Claiborne Rigg, NP  ibuprofen (ADVIL) 600 MG tablet Take 1 tablet (600 mg total) by mouth 3 (three) times daily. X 7 days then prn pain 12/06/18   Anders Simmonds, PA-C     Objective:  EXAM:   Vitals:   12/06/18 1049  Weight: 107 lb 12.8 oz (48.9 kg)  Height: 5\' 2"  (1.575 m)    General appearance : A&OX3. NAD. Non-toxic-appearing HEENT: Atraumatic and Normocephalic.  PERRLA. EOM intact.   Chest/Lungs:  Breathing-non-labored, Good air entry bilaterally, breath sounds normal without rales,  rhonchi, or wheezing  CVS: S1 S2 regular, no murmurs, gallops, rubs  R hand and L examined.  Full S&ROM.  Normal capillary RF. Normal grip.  Neurology:  CN II-XII grossly intact, Non focal.   Psych:  TP linear. J/I WNL. Normal speech. Appropriate eye contact and affect.  Skin:  No Rash  Data Review No results found for: HGBA1C   Assessment & Plan   1. De Quervain's disease (tenosynovitis) -obtain R thumb spica splint - ibuprofen (ADVIL) 600 MG tablet; Take 1 tablet (600 mg total) by mouth 3 (three) times daily. X 7 days then prn pain  Dispense: 60 tablet; Refill: 0  2. Routine health maintenance BC control planning.  150 IM today.  Repeat in 3 months - POCT urine pregnancy   Patient have been counseled extensively about nutrition and exercise  Return in about 3 months (around 03/08/2019) for for next depo .  The patient was given clear instructions to go to ER or return to medical center if symptoms don't improve, worsen or new problems develop. The patient verbalized understanding. The patient was told to call to get lab results if they haven't heard anything in the next week.     Georgian Co, PA-C [facility] and [facility] [place], [place] [phone]   12/06/2018, 11:10 AM

## 2018-12-06 NOTE — Patient Instructions (Signed)
De Quervain's Tenosynovitis    De Quervain's tenosynovitis is a condition that causes inflammation of the tendon on the thumb side of the wrist. Tendons are cords of tissue that connect bones to muscles. The tendons in the hand pass through a tunnel called a sheath. A slippery layer of tissue (synovium) lets the tendons move smoothly in the sheath. With de Quervain's tenosynovitis, the sheath swells or thickens, causing friction and pain.  The condition is also called de Quervain's disease and de Quervain's syndrome. It occurs most often in women who are 30-50 years old.  What are the causes?  The exact cause of this condition is not known. It may be associated with overuse of the hand and wrist.  What increases the risk?  You are more likely to develop this condition if you:  · Use your hands far more than normal, especially if you repeat certain movements that involve twisting your hand or using a tight grip.  · Are pregnant.  · Are a middle-aged woman.  · Have rheumatoid arthritis.  · Have diabetes.  What are the signs or symptoms?  The main symptom of this condition is pain on the thumb side of the wrist. The pain may get worse when you grasp something or turn your wrist. Other symptoms may include:  · Pain that extends up the forearm.  · Swelling of your wrist and hand.  · Trouble moving the thumb and wrist.  · A sensation of snapping in the wrist.  · A bump filled with fluid (cyst) in the area of the pain.  How is this diagnosed?  This condition may be diagnosed based on:  · Your symptoms and medical history.  · A physical exam. During the exam, your health care provider may do a simple test (Finkelstein test) that involves pulling your thumb and wrist to see if this causes pain.  You may also need to have an X-ray.  How is this treated?  Treatment for this condition may include:  · Avoiding any activity that causes pain and swelling.  · Taking medicines. Anti-inflammatory medicines and corticosteroid  injections may be used to reduce inflammation and relieve pain.  · Wearing a splint.  · Having surgery. This may be needed if other treatments do not work.  Once the pain and swelling has gone down:  · Physical therapy. This includes stretching and strengthening exercises.  · Occupational therapy. This includes adjusting how you move your wrist.  Follow these instructions at home:  If you have a splint:  · Wear the splint as told by your health care provider. Remove it only as told by your health care provider.  · Loosen the splint if your fingers tingle, become numb, or turn cold and blue.  · Keep the splint clean.  · If the splint is not waterproof:  ? Do not let it get wet.  ? Cover it with a watertight covering when you take a bath or a shower.  Managing pain, stiffness, and swelling    · Avoid movements and activities that cause pain and swelling in the wrist area.  · If directed, put ice on the painful area. This may be helpful after doing activities that involve the sore wrist.  ? Put ice in a plastic bag.  ? Place a towel between your skin and the bag.  ? Leave the ice on for 20 minutes, 2-3 times a day.  · Move your fingers often to avoid stiffness and to lessen   swelling.  · Raise (elevate) the injured area above the level of your heart while you are sitting or lying down.  General instructions  · Return to your normal activities as told by your health care provider. Ask your health care provider what activities are safe for you.  · Take over-the-counter and prescription medicines only as told by your health care provider.  · Keep all follow-up visits as told by your health care provider. This is important.  Contact a health care provider if:  · Your pain medicine does not help.  · Your pain gets worse.  · You develop new symptoms.  Summary  · De Quervain's tenosynovitis is a condition that causes inflammation of the tendon on the thumb side of the wrist.  · The condition occurs most often in women who are  30-50 years old.  · The exact cause of this condition is not known. It may be associated with overuse of the hand and wrist.  · Treatment starts with avoiding activity that causes pain or swelling in the wrist area. Other treatment may include wearing a splint and taking medicine. Sometimes, surgery is needed.  This information is not intended to replace advice given to you by your health care provider. Make sure you discuss any questions you have with your health care provider.  Document Released: 04/13/2001 Document Revised: 01/19/2018 Document Reviewed: 06/27/2017  Elsevier Interactive Patient Education © 2019 [employer]

## 2018-12-06 NOTE — Progress Notes (Signed)
Patient is here for a pregnancy test after missing her Depo shot in February. Patient complaining of a right wrist injury.  States present two days.

## 2019-01-03 ENCOUNTER — Ambulatory Visit: Payer: Medicaid Other | Attending: Nurse Practitioner | Admitting: Nurse Practitioner

## 2019-01-03 ENCOUNTER — Other Ambulatory Visit: Payer: Self-pay

## 2019-01-03 ENCOUNTER — Encounter: Payer: Self-pay | Admitting: Nurse Practitioner

## 2019-01-03 DIAGNOSIS — R51 Headache: Secondary | ICD-10-CM | POA: Diagnosis not present

## 2019-01-03 DIAGNOSIS — R0981 Nasal congestion: Secondary | ICD-10-CM | POA: Insufficient documentation

## 2019-01-03 MED ORDER — CETIRIZINE-PSEUDOEPHEDRINE ER 5-120 MG PO TB12: 1.0000 | ORAL_TABLET | Freq: Two times a day (BID) | ORAL | 0 refills | Status: DC

## 2019-01-03 MED ORDER — FLUTICASONE PROPIONATE 50 MCG/ACT NA SUSP: 2.0000 | Freq: Every day | NASAL | 6 refills | Status: DC

## 2019-01-03 NOTE — Progress Notes (Signed)
Virtual Visit via Telephone Note Due to national recommendations of social distancing due to COVID 19, telehealth visit is felt to be most appropriate for this patient at this time.  I discussed the limitations, risks, security and privacy concerns of performing an evaluation and management service by telephone and the availability of in person appointments. I also discussed with the patient that there may be a patient responsible charge related to this service. The patient expressed understanding and agreed to proceed.    I connected with Andre Lefort on 01/03/19  at   9:10 AM EDT  EDT by telephone and verified that I am speaking with the correct person using two identifiers.   Consent I discussed the limitations, risks, security and privacy concerns of performing an evaluation and management service by telephone and the availability of in person appointments. I also discussed with the patient that there may be a patient responsible charge related to this service. The patient expressed understanding and agreed to proceed.   Location of Patient: Private Residence   [facility] of Provider: [facility]    Persons participating in Telemedicine visit: Bertram Denver FNP-BC [facility] CMA Andre Lefort    History of Present Illness: Telemedicine visit for: Nasal congestion   NASAL CONGESTION  Mary Glover is here for evaluation of  Nasal congestion. Patient's other symptoms include headaches and sinus pressure. These symptoms are perennial with seasonal exacerbation. Current triggers include exposure to no known precipitant. The patient has been suffering from these symptoms for approximately 3 days. The patient has tried over the counter medications with inadequate relief of symptoms. Immunotherapy has never been tried. The patient has never had nasal polyps. The patient has no history of asthma. The patient does not suffer from frequent sinopulmonary  infections. The patient has not had sinus surgery in the past. The patient has no history of eczema.  She is currently taking Zantac for her chronic allergy and ibuprofen for mild headaches.  Denies fevers, sore throat, cough.  She is not a smoker.    Past Medical History:  Diagnosis Date  . Allergy     History reviewed. No pertinent surgical history.  Family History  Problem Relation Age of Onset  . Diabetes Mother   . Hypertension Father     Social History   Socioeconomic History  . Marital status: Single    Spouse name: Not on file  . Number of children: Not on file  . Years of education: Not on file  . Highest education level: Not on file  Occupational History  . Not on file  Social Needs  . Financial resource strain: Not on file  . Food insecurity:    Worry: Not on file    Inability: Not on file  . Transportation needs:    Medical: Not on file    Non-medical: Not on file  Tobacco Use  . Smoking status: Never Smoker  . Smokeless tobacco: Never Used  Substance and Sexual Activity  . Alcohol use: No  . Drug use: No  . Sexual activity: Yes    Birth control/protection: Injection  Lifestyle  . Physical activity:    Days per week: Not on file    Minutes per session: Not on file  . Stress: Not on file  Relationships  . Social connections:    Talks on phone: Not on file    Gets together: Not on file    Attends religious service: Not on file    Active  member of club or organization: Not on file    Attends meetings of clubs or organizations: Not on file    Relationship status: Not on file  Other Topics Concern  . Not on file  Social History Narrative  . Not on file     Observations/Objective: Awake, alert and oriented x 3   Review of Systems  Constitutional: Negative for fever, malaise/fatigue and weight loss.  HENT: Positive for congestion. Negative for nosebleeds.   Eyes: Negative.  Negative for blurred vision, double vision and photophobia.   Respiratory: Negative.  Negative for cough and shortness of breath.   Cardiovascular: Negative.  Negative for chest pain, palpitations and leg swelling.  Gastrointestinal: Negative.  Negative for heartburn, nausea and vomiting.  Musculoskeletal: Negative.  Negative for myalgias.  Neurological: Positive for headaches. Negative for dizziness, focal weakness and seizures.  Endo/Heme/Allergies: Positive for environmental allergies.  Psychiatric/Behavioral: Negative.  Negative for suicidal ideas.    Assessment and Plan: Mary Glover was evaluated today for nasal congestion.  Diagnoses and all orders for this visit:  Nasal sinus congestion -     fluticasone (FLONASE) 50 MCG/ACT nasal spray; Place 2 sprays into both nostrils daily. -     cetirizine-pseudoephedrine (ZYRTEC-D ALLERGY & CONGESTION) 5-120 MG tablet; Take 1 tablet by mouth 2 (two) times daily for 14 days.     Follow Up Instructions Return if symptoms worsen or fail to improve.     I discussed the assessment and treatment plan with the patient. The patient was provided an opportunity to ask questions and all were answered. The patient agreed with the plan and demonstrated an understanding of the instructions.   The patient was advised to call back or seek an in-person evaluation if the symptoms worsen or if the condition fails to improve as anticipated.  I provided 16 minutes of non-face-to-face time during this encounter including median intraservice time, reviewing previous notes, labs, imaging, medications and explaining diagnosis and management.  Claiborne RiggZelda W Akeela Busk, FNP-BC

## 2019-01-11 ENCOUNTER — Telehealth: Payer: Self-pay | Admitting: Nurse Practitioner

## 2019-01-11 NOTE — Telephone Encounter (Signed)
Patients call returned.  Patient identified by name and date of birth.  Patient stating symptoms of sore throat body weakness and congestion persist even after taken prescribed medications.  Patient complains of a increase in the sore throat.  Patient unable to go to work.  Patient advised to stop Flonase for the present time.  Patient advised to drink plenty of water.  Patient advised to make a tea out of lemon juice, honey and hot water. Patient advise to keep schedualed appointment tomorrow with provider Raul Del.  Patient acknowledged understanding of advice.

## 2019-01-11 NOTE — Telephone Encounter (Signed)
Patient called stating she has a sore throat and is unsure if its the medication but that she would like to know what she should do please follow up

## 2019-01-12 ENCOUNTER — Ambulatory Visit: Payer: Medicaid Other | Attending: Nurse Practitioner | Admitting: Nurse Practitioner

## 2019-01-12 ENCOUNTER — Encounter: Payer: Self-pay | Admitting: Nurse Practitioner

## 2019-01-12 ENCOUNTER — Other Ambulatory Visit: Payer: Self-pay

## 2019-01-12 DIAGNOSIS — J302 Other seasonal allergic rhinitis: Secondary | ICD-10-CM | POA: Diagnosis not present

## 2019-01-12 DIAGNOSIS — R0981 Nasal congestion: Secondary | ICD-10-CM

## 2019-01-12 MED ORDER — AZELASTINE-FLUTICASONE 137-50 MCG/ACT NA SUSP: 2.0000 | Freq: Every day | NASAL | 0 refills | Status: DC

## 2019-01-12 MED ORDER — CETIRIZINE-PSEUDOEPHEDRINE ER 5-120 MG PO TB12: 1.0000 | ORAL_TABLET | Freq: Two times a day (BID) | ORAL | 0 refills | Status: AC

## 2019-01-12 NOTE — Progress Notes (Signed)
Virtual Visit via Telephone Note Due to national recommendations of social distancing due to COVID 19, telehealth visit is felt to be most appropriate for this patient at this time.  I discussed the limitations, risks, security and privacy concerns of performing an evaluation and management service by telephone and the availability of in person appointments. I also discussed with the patient that there may be a patient responsible charge related to this service. The patient expressed understanding and agreed to proceed.    I connected with Mary LefortKhadara Glover on 01/12/19  at   8:30 AM EDT  EDT by telephone and verified that I am speaking with the correct person using two identifiers.   Consent I discussed the limitations, risks, security and privacy concerns of performing an evaluation and management service by telephone and the availability of in person appointments. I also discussed with the patient that there may be a patient responsible charge related to this service. The patient expressed understanding and agreed to proceed.   Location of Patient: Private  Residence   [facility]    Persons participating in Telemedicine visit: Bertram DenverZelda Ell Tiso FNP-BC [facility] CMA Mary LefortKhadara Nickless    History of Present Illness: Telemedicine visit for: upper respiratory illness  Upper Respiratory Illness Patient complains of Scratchy throat, Right nasal congestion and pain, and hoarseness. She denies fever, headache and cough. Symptoms started a few days ago. She was prescribed zyrtec D several weeks ago but today states the pharmacy never called her to pick up.  She is drinking plenty of fluids. Evaluation to date: none. Treatment to date: nasal steroids and ceterizine daily.   Past Medical History:  Diagnosis Date  . Allergy     No past surgical history on file.  Family History  Problem Relation Age of Onset  . Diabetes Mother   . Hypertension  Father     Social History   Socioeconomic History  . Marital status: Single    Spouse name: Not on file  . Number of children: Not on file  . Years of education: Not on file  . Highest education level: Not on file  Occupational History  . Not on file  Social Needs  . Financial resource strain: Not on file  . Food insecurity    Worry: Not on file    Inability: Not on file  . Transportation needs    Medical: Not on file    Non-medical: Not on file  Tobacco Use  . Smoking status: Never Smoker  . Smokeless tobacco: Never Used  Substance and Sexual Activity  . Alcohol use: No  . Drug use: No  . Sexual activity: Yes    Birth control/protection: Injection  Lifestyle  . Physical activity    Days per week: Not on file    Minutes per session: Not on file  . Stress: Not on file  Relationships  . Social Musicianconnections    Talks on phone: Not on file    Gets together: Not on file    Attends religious service: Not on file    Active member of club or organization: Not on file    Attends meetings of clubs or organizations: Not on file    Relationship status: Not on file  Other Topics Concern  . Not on file  Social History Narrative  . Not on file     Observations/Objective: Awake, alert and oriented x 3   Review of Systems  Constitutional: Negative for fever, malaise/fatigue  and weight loss.  HENT: Positive for congestion and sore throat. Negative for ear discharge, ear pain, hearing loss, nosebleeds and tinnitus.        SEE HPI  Eyes: Negative.  Negative for blurred vision, double vision and photophobia.  Respiratory: Negative.  Negative for cough and shortness of breath.   Cardiovascular: Negative.  Negative for chest pain, palpitations and leg swelling.  Gastrointestinal: Negative.  Negative for heartburn, nausea and vomiting.  Musculoskeletal: Negative.  Negative for myalgias.  Neurological: Negative.  Negative for dizziness, focal weakness, seizures and headaches.   Psychiatric/Behavioral: Negative.  Negative for suicidal ideas.    Assessment and Plan: Diagnoses and all orders for this visit:  Seasonal allergic rhinitis, unspecified trigger -     Azelastine-Fluticasone 137-50 MCG/ACT SUSP; Place 2 sprays into the nose daily for 30 days. STOP FLONASE WHILE USING THIS SPRAY -     cetirizine-pseudoephedrine (ZYRTEC-D ALLERGY & CONGESTION) 5-120 MG tablet; Take 1 tablet by mouth 2 (two) times daily for 14 days.  Nasal sinus congestion -     Azelastine-Fluticasone 137-50 MCG/ACT SUSP; Place 2 sprays into the nose daily for 30 days. STOP FLONASE WHILE USING THIS SPRAY -     cetirizine-pseudoephedrine (ZYRTEC-D ALLERGY & CONGESTION) 5-120 MG tablet; Take 1 tablet by mouth 2 (two) times daily for 14 days.     Follow Up Instructions Return in about 4 weeks (around 02/09/2019) for CALL IF SYMPTOMS WORSEN OR NO CHANGE IN 1 WEEK. WILL WORK IN.     I discussed the assessment and treatment plan with the patient. The patient was provided an opportunity to ask questions and all were answered. The patient agreed with the plan and demonstrated an understanding of the instructions.   The patient was advised to call back or seek an in-person evaluation if the symptoms worsen or if the condition fails to improve as anticipated.  I provided 18 minutes of non-face-to-face time during this encounter including median intraservice time, reviewing previous notes, labs, imaging, medications and explaining diagnosis and management.  Gildardo Pounds, FNP-BC

## 2019-01-12 NOTE — Progress Notes (Signed)
States at times breathing is a little heavy and nasal congestion x 2-3 weeks  She was told to discontinue the flonase d/t throat irritation

## 2019-01-15 ENCOUNTER — Telehealth: Payer: Self-pay | Admitting: Nurse Practitioner

## 2019-01-15 NOTE — Telephone Encounter (Signed)
Patient called stating she received the wrong dates on her letter. Please follow up.   Patient states she was also not able to get her prescription and states that she was told to contact her PCP for insurance purposes. Please follow up.

## 2019-01-17 ENCOUNTER — Other Ambulatory Visit: Payer: Self-pay

## 2019-01-17 DIAGNOSIS — J302 Other seasonal allergic rhinitis: Secondary | ICD-10-CM

## 2019-01-17 DIAGNOSIS — R0981 Nasal congestion: Secondary | ICD-10-CM

## 2019-01-17 MED ORDER — AZELASTINE HCL 137 MCG/SPRAY NA SOLN: 2.0000 | Freq: Every day | NASAL | 0 refills | Status: DC

## 2019-01-17 NOTE — Telephone Encounter (Signed)
Patient mother called to check on the letter please follow up.

## 2019-01-17 NOTE — Telephone Encounter (Signed)
Pt would like to know if appt is in person or televisit, please follow up

## 2019-01-17 NOTE — Telephone Encounter (Signed)
CMA spoke to patient. Pt. Will come and pick up the correct letter. Letter will be in the front.

## 2019-01-18 ENCOUNTER — Other Ambulatory Visit: Payer: Self-pay

## 2019-01-18 ENCOUNTER — Ambulatory Visit: Payer: Medicaid Other | Attending: Nurse Practitioner | Admitting: Physician Assistant

## 2019-01-18 DIAGNOSIS — J069 Acute upper respiratory infection, unspecified: Secondary | ICD-10-CM | POA: Diagnosis not present

## 2019-01-18 MED ORDER — AZITHROMYCIN 250 MG PO TABS: ORAL_TABLET | ORAL | 0 refills | Status: DC

## 2019-01-18 NOTE — Progress Notes (Signed)
Patient stated she is having trouble breathing due to congestion and wearing mask at work makes it hard for her to breathe.

## 2019-01-18 NOTE — Progress Notes (Signed)
Virtual Visit via Telephone Note  I connected with Mary Glover on 01/18/19 at  8:30 AM EDT by telephone and verified that I am speaking with the correct person using two identifiers.   I discussed the limitations, risks, security and privacy concerns of performing an evaluation and management service by telephone and the availability of in person appointments. I also discussed with the patient that there may be a patient responsible charge related to this service. The patient expressed understanding and agreed to proceed.  Patient location:  Home  My Location:  [place] office Persons on the call: myself and the patient  History of Present Illness: Still having congestion and dryness in sinus and nose;  Having to wear mask at work. Coughing up yellowish mucus.   No fever.  Taking zyrtec D for about 4 days.  She is having trouble wearing a mask at work.  Nose is dry.  Some blood at times.  She has had multiple visits/calls for this over the last few weeks.  +sinus pressure.      Observations/Objective:  A&Ox3   Assessment and Plan: 1. Upper respiratory tract infection, unspecified type Will cover for infection.  Advised cold air humidifier and apply vaseline into nostrils bid.  OOW x 2 days.   - azithromycin (ZITHROMAX) 250 MG tablet; Take 2 today then 1 daily  Dispense: 6 tablet; Refill: 0    Follow Up Instructions: prn   I discussed the assessment and treatment plan with the patient. The patient was provided an opportunity to ask questions and all were answered. The patient agreed with the plan and demonstrated an understanding of the instructions.   The patient was advised to call back or seek an in-person evaluation if the symptoms worsen or if the condition fails to improve as anticipated.  I provided 11 minutes of non-face-to-face time during this encounter.   Freeman Caldron, PA-C  Patient ID: Mary Glover, female   DOB: [birth year], 18 y.o.   MRN: [id number]

## 2019-03-09 ENCOUNTER — Ambulatory Visit: Payer: Medicaid Other | Attending: Family Medicine | Admitting: *Deleted

## 2019-03-09 ENCOUNTER — Other Ambulatory Visit: Payer: Self-pay

## 2019-03-09 DIAGNOSIS — Z309 Encounter for contraceptive management, unspecified: Secondary | ICD-10-CM

## 2019-03-09 LAB — POCT URINE PREGNANCY: Preg Test, Ur: NEGATIVE

## 2019-03-09 MED ORDER — MEDROXYPROGESTERONE ACETATE 150 MG/ML IM SUSP
150.0000 mg | Freq: Once | INTRAMUSCULAR | Status: AC
  Administered 2019-03-09: 16:00:00 150 mg via INTRAMUSCULAR

## 2019-03-09 NOTE — Progress Notes (Signed)
Date last pap: May. Last Depo-Provera: [id number]. Side Effects if any: none HCG indicated? Yes. Urine. Urine pregnancy test is negative.  Depo-Provera 150 mg IM given by T. Darcell Sabino,RN Next appointment due October 23,2020 through November 6. Patient verbalized understanding.

## 2019-05-24 ENCOUNTER — Ambulatory Visit: Payer: Medicaid Other | Attending: Family Medicine | Admitting: *Deleted

## 2019-05-24 ENCOUNTER — Other Ambulatory Visit: Payer: Self-pay

## 2019-05-24 DIAGNOSIS — Z309 Encounter for contraceptive management, unspecified: Secondary | ICD-10-CM

## 2019-05-24 DIAGNOSIS — Z3042 Encounter for surveillance of injectable contraceptive: Secondary | ICD-10-CM | POA: Diagnosis not present

## 2019-05-24 MED ORDER — MEDROXYPROGESTERONE ACETATE 150 MG/ML IM SUSP
150.0000 mg | Freq: Once | INTRAMUSCULAR | Status: AC
  Administered 2019-05-24: 17:00:00 150 mg via INTRAMUSCULAR

## 2019-05-24 NOTE — Progress Notes (Signed)
Date last pap: May. Last Depo-Provera: 03/09/2019 Side Effects if any: none HCG indicated? no  Depo-Provera 150 mg IM given by T. Jahnyla Parrillo,RN Next appointment due January 7 through January 21,2020. Patient verbalized understanding.

## 2019-05-31 ENCOUNTER — Other Ambulatory Visit: Payer: Self-pay

## 2019-05-31 ENCOUNTER — Ambulatory Visit (HOSPITAL_COMMUNITY)
Admission: EM | Admit: 2019-05-31 | Discharge: 2019-05-31 | Disposition: A | Discharge status: Home / Self Care | Payer: Medicaid Other | Attending: Family Medicine | Admitting: Family Medicine

## 2019-05-31 ENCOUNTER — Encounter (HOSPITAL_COMMUNITY): Payer: Self-pay

## 2019-05-31 DIAGNOSIS — R000 Tachycardia, unspecified: Secondary | ICD-10-CM | POA: Diagnosis not present

## 2019-05-31 DIAGNOSIS — J029 Acute pharyngitis, unspecified: Secondary | ICD-10-CM

## 2019-05-31 DIAGNOSIS — Z20828 Contact with and (suspected) exposure to other viral communicable diseases: Secondary | ICD-10-CM | POA: Diagnosis not present

## 2019-05-31 DIAGNOSIS — Z20822 Contact with and (suspected) exposure to covid-19: Secondary | ICD-10-CM

## 2019-05-31 LAB — BASIC METABOLIC PANEL
Anion gap: 13 (ref 5–15)
BUN: 8 mg/dL (ref 6–20)
CO2: 20 mmol/L — ABNORMAL LOW (ref 22–32)
Calcium: 9.7 mg/dL (ref 8.9–10.3)
Chloride: 103 mmol/L (ref 98–111)
Creatinine, Ser: 0.81 mg/dL (ref 0.44–1.00)
GFR calc Af Amer: >60 mL/min (ref >60)
GFR calc non Af Amer: >60 mL/min (ref >60)
Glucose, Bld: 90 mg/dL (ref 70–99)
Potassium: 3.6 mmol/L (ref 3.5–5.1)
Sodium: 136 mmol/L (ref 135–145)

## 2019-05-31 LAB — CBC
HCT: 39.5 % (ref 36.0–46.0)
Hemoglobin: 13.2 g/dL (ref 12.0–15.0)
MCH: 29.1 pg (ref 26.0–34.0)
MCHC: 33.4 g/dL (ref 30.0–36.0)
MCV: 87.2 fL (ref 80.0–100.0)
Platelets: 158 10*3/uL (ref 150–400)
RBC: 4.53 MIL/uL (ref 3.87–5.11)
RDW: 13.5 % (ref 11.5–15.5)
WBC: 9.4 10*3/uL (ref 4.0–10.5)
nRBC: 0 % (ref 0.0–0.2)

## 2019-05-31 LAB — POCT RAPID STREP A: Streptococcus, Group A Screen (Direct): NEGATIVE

## 2019-05-31 MED ORDER — SODIUM CHLORIDE 0.9 % IV BOLUS
1000.0000 mL | Freq: Once | INTRAVENOUS | Status: AC
  Administered 2019-05-31: 10:00:00 1000 mL via INTRAVENOUS

## 2019-05-31 MED ORDER — IBUPROFEN 800 MG PO TABS: 800.0000 mg | ORAL_TABLET | Freq: Three times a day (TID) | ORAL | 0 refills | Status: DC | PRN

## 2019-05-31 MED ORDER — ACETAMINOPHEN 325 MG PO TABS
650.0000 mg | ORAL_TABLET | Freq: Once | ORAL | Status: AC
  Administered 2019-05-31: 10:00:00 650 mg via ORAL

## 2019-05-31 MED ORDER — ACETAMINOPHEN 325 MG PO TABS
ORAL_TABLET | ORAL | Status: AC
  Filled 2019-05-31: qty 2

## 2019-05-31 MED ORDER — SODIUM CHLORIDE 0.9 % IV BOLUS
1000.0000 mL | Freq: Once | INTRAVENOUS | Status: AC
  Administered 2019-05-31: 1000 mL via INTRAVENOUS

## 2019-05-31 NOTE — ED Triage Notes (Signed)
Pt presents with sore throat, headache, and bilateral ear pain since yesterday.

## 2019-05-31 NOTE — ED Provider Notes (Addendum)
[place]    CSN: [id number] Arrival date & time: 05/31/19  0805      History   Chief Complaint Chief Complaint  Patient presents with  . Sore Throat  . Headache  . Otalgia    HPI Mary Glover is a 18 y.o. female.   Patient presents with malaise, sore throat, chills, ear pain, headache x2 days.  She denies cough, shortness of breath, vomiting, diarrhea, or other symptoms.  She denies fever at home.  No treatments attempted at home.  No known exposures to other sick persons.  The history is provided by the patient.    Past Medical History:  Diagnosis Date  . Allergy     Patient Active Problem List   Diagnosis Date Noted  . Nasal sinus congestion 01/12/2019  . Patellofemoral pain syndrome of both knees 09/23/2017  . Hypermobility syndrome 09/23/2017    History reviewed. No pertinent surgical history.  OB History   No obstetric history on file.      Home Medications    Prior to Admission medications   Medication Sig Start Date End Date Taking? Authorizing Provider  Azelastine HCl 137 MCG/SPRAY SOLN Place 2 sprays into the nose daily. Each nostril, USE WITH FLONASE. 01/17/19   Gildardo Pounds, NP  azithromycin (ZITHROMAX) 250 MG tablet Take 2 today then 1 daily 01/18/19   Argentina Donovan, PA-C  cetirizine (ZYRTEC) 10 MG tablet Take 1 tablet (10 mg total) by mouth daily. 09/13/18   Argentina Donovan, PA-C  fluticasone (FLONASE) 50 MCG/ACT nasal spray Place 2 sprays into both nostrils daily. Patient not taking: Reported on 01/12/2019 01/03/19   Gildardo Pounds, NP  ibuprofen (ADVIL) 800 MG tablet Take 1 tablet (800 mg total) by mouth every 8 (eight) hours as needed. 05/31/19   Sharion Balloon, NP  Vitamin D, Ergocalciferol, (DRISDOL) 1.25 MG (50000 UT) CAPS capsule Take 1 capsule (50,000 Units total) by mouth every 7 (seven) days for 90 doses. 11/06/18 07/22/20  Gildardo Pounds, NP    Family History Family History  Problem Relation Age of Onset  .  Diabetes Mother   . Hypertension Father     Social History Social History   Tobacco Use  . Smoking status: Never Smoker  . Smokeless tobacco: Never Used  Substance Use Topics  . Alcohol use: No  . Drug use: No     Allergies   Patient has no known allergies.   Review of Systems Review of Systems  Constitutional: Positive for chills. Negative for fever.  HENT: Positive for ear pain and sore throat.   Eyes: Negative for pain and visual disturbance.  Respiratory: Negative for cough and shortness of breath.   Cardiovascular: Negative for chest pain and palpitations.  Gastrointestinal: Negative for abdominal pain, diarrhea, nausea and vomiting.  Genitourinary: Negative for dysuria and hematuria.  Musculoskeletal: Negative for arthralgias and back pain.  Skin: Negative for color change and rash.  Neurological: Positive for headaches. Negative for dizziness, seizures, syncope and weakness.  All other systems reviewed and are negative.    Physical Exam Triage Vital Signs ED Triage Vitals  Enc Vitals Group     BP      Pulse      Resp      Temp      Temp src      SpO2      Weight      Height      Head Circumference  Peak Flow      Pain Score      Pain Loc      Pain Edu?      Excl. in GC?    Orthostatic VS for the past 24 hrs:  BP- Lying Pulse- Lying BP- Sitting Pulse- Sitting BP- Standing at 0 minutes Pulse- Standing at 0 minutes  05/31/19 1204 116/61 115 122/76 120 124/78 120  05/31/19 1102 116/65 118 123/63 117 124/67 132  05/31/19 0957 118/76 112 120/78 127 123/74 153    Updated Vital Signs BP 128/78 (BP Location: Left Arm)   Pulse (!) 121   Temp 99.2 F (37.3 C) (Oral)   Resp 18   SpO2 98%   Visual Acuity Right Eye Distance:   Left Eye Distance:   Bilateral Distance:    Right Eye Near:   Left Eye Near:    Bilateral Near:     Physical Exam Vitals signs and nursing note reviewed.  Constitutional:      General: She is not in acute distress.     Appearance: She is well-developed. She is ill-appearing.  HENT:     Head: Normocephalic and atraumatic.     Right Ear: Tympanic membrane and ear canal normal.     Left Ear: Tympanic membrane and ear canal normal.     Nose: Nose normal.     Mouth/Throat:     Mouth: Mucous membranes are moist.     Pharynx: Oropharyngeal exudate and posterior oropharyngeal erythema present.  Eyes:     Conjunctiva/sclera: Conjunctivae normal.  Neck:     Musculoskeletal: Neck supple.  Cardiovascular:     Rate and Rhythm: Regular rhythm. Tachycardia present.     Heart sounds: No murmur.  Pulmonary:     Effort: Pulmonary effort is normal. No respiratory distress.     Breath sounds: Normal breath sounds.  Abdominal:     General: Bowel sounds are normal.     Palpations: Abdomen is soft.     Tenderness: There is no abdominal tenderness. There is no right CVA tenderness, left CVA tenderness, guarding or rebound.  Skin:    General: Skin is warm and dry.     Findings: No rash.  Neurological:     General: No focal deficit present.     Mental Status: She is alert and oriented to person, place, and time.      UC Treatments / Results  Labs (all labs ordered are listed, but only abnormal results are displayed) Labs Reviewed  BASIC METABOLIC PANEL - Abnormal; Notable for the following components:      Result Value   CO2 20 (*)    All other components within normal limits  NOVEL CORONAVIRUS, NAA (HOSP ORDER, SEND-OUT TO REF LAB; TAT 18-24 HRS)  CULTURE, GROUP A STREP [facility])  CBC  POCT RAPID STREP A    EKG   Radiology No results found.  Procedures Procedures (including critical care time)  Medications Ordered in UC Medications  sodium chloride 0.9 % bolus 1,000 mL (1,000 mLs Intravenous New Bag/Given 05/31/19 0953)  acetaminophen (TYLENOL) tablet 650 mg (650 mg Oral Given 05/31/19 1017)  sodium chloride 0.9 % bolus 1,000 mL (1,000 mLs Intravenous New Bag/Given 05/31/19 1119)  acetaminophen  (TYLENOL) 325 MG tablet (has no administration in time range)    Initial Impression / Assessment and Plan / UC Course  I have reviewed the triage vital signs and the nursing notes.  Pertinent labs & imaging results that were available during my care  of the patient were reviewed by me and considered in my medical decision making (see chart for details).    Pharyngitis.  Tachycardia.  Suspected Covid.  EKG shows sinus tachycardia, rate 118, no ST elevation.  Rapid strep negative; Throat culture pending.  COVID test performed here.  Patient reports feeling significantly better after IV fluids and Tylenol; she appears improved.  Patient declines transfer to the emergency department.  Instructed patient to self quarantine until her test results come back.  Instructed her to follow-up with her PCP or return here tomorrow for a recheck.  Instructed patient to go to the emergency department if she develops high fever, chest pain, dizziness, headache, weakness, shortness of breath, severe diarrhea or vomiting, or other concerning symptoms.  Patient agrees to plan of care.  Final Clinical Impressions(s) / UC Diagnoses   Final diagnoses:  Pharyngitis, unspecified etiology  Tachycardia  Suspected COVID-19 virus infection     Discharge Instructions     Your heart rate is elevated.  Keep yourself hydrated with 8 to 10 glasses of water each day.    Follow-up with your primary care provider or return here tomorrow for a recheck.  Your rapid strep test is negative.  A throat culture is pending; we will call you if it is positive requiring treatment.    Your COVID test is pending.  You should self quarantine until your test result is back and is negative.    Go to the emergency department if you develop high fever, chest pain, dizziness, headache, weakness, shortness of breath, severe diarrhea, or other concerning symptoms.       ED Prescriptions    Medication Sig Dispense Auth. Provider    ibuprofen (ADVIL) 800 MG tablet Take 1 tablet (800 mg total) by mouth every 8 (eight) hours as needed. 21 tablet Mickie Bailate, Malyah Ohlrich H, NP     PDMP not reviewed this encounter.   Mickie Bailate, Adiel Erney H, NP 05/31/19 0901    Mickie Bailate, Trenton Passow H, NP 05/31/19 1212

## 2019-05-31 NOTE — Discharge Instructions (Addendum)
Your heart rate is elevated.  Keep yourself hydrated with 8 to 10 glasses of water each day.    Follow-up with your primary care provider or return here tomorrow for a recheck.  Your rapid strep test is negative.  A throat culture is pending; we will call you if it is positive requiring treatment.    Your COVID test is pending.  You should self quarantine until your test result is back and is negative.    Go to the emergency department if you develop high fever, chest pain, dizziness, headache, weakness, shortness of breath, severe diarrhea, or other concerning symptoms.

## 2019-06-02 LAB — CULTURE, GROUP A STREP (THRC)

## 2019-06-02 LAB — NOVEL CORONAVIRUS, NAA (HOSP ORDER, SEND-OUT TO REF LAB; TAT 18-24 HRS): SARS-CoV-2, NAA: NOT DETECTED

## 2019-06-19 ENCOUNTER — Emergency Department (HOSPITAL_COMMUNITY)
Admission: EM | Admit: 2019-06-19 | Discharge: 2019-06-19 | Disposition: A | Discharge status: Home / Self Care | Payer: Medicaid Other | Attending: Emergency Medicine | Admitting: Emergency Medicine

## 2019-06-19 ENCOUNTER — Emergency Department (HOSPITAL_COMMUNITY): Payer: Medicaid Other

## 2019-06-19 ENCOUNTER — Other Ambulatory Visit: Payer: Self-pay

## 2019-06-19 DIAGNOSIS — J0100 Acute maxillary sinusitis, unspecified: Secondary | ICD-10-CM | POA: Insufficient documentation

## 2019-06-19 DIAGNOSIS — B9789 Other viral agents as the cause of diseases classified elsewhere: Secondary | ICD-10-CM | POA: Diagnosis not present

## 2019-06-19 DIAGNOSIS — Z79899 Other long term (current) drug therapy: Secondary | ICD-10-CM | POA: Diagnosis not present

## 2019-06-19 DIAGNOSIS — J069 Acute upper respiratory infection, unspecified: Secondary | ICD-10-CM | POA: Diagnosis not present

## 2019-06-19 DIAGNOSIS — R079 Chest pain, unspecified: Secondary | ICD-10-CM | POA: Diagnosis not present

## 2019-06-19 DIAGNOSIS — R0602 Shortness of breath: Secondary | ICD-10-CM | POA: Diagnosis present

## 2019-06-19 DIAGNOSIS — R0789 Other chest pain: Secondary | ICD-10-CM | POA: Diagnosis not present

## 2019-06-19 LAB — CBC
HCT: 36.9 % (ref 36.0–46.0)
Hemoglobin: 11.8 g/dL — ABNORMAL LOW (ref 12.0–15.0)
MCH: 28.9 pg (ref 26.0–34.0)
MCHC: 32 g/dL (ref 30.0–36.0)
MCV: 90.4 fL (ref 80.0–100.0)
Platelets: 151 10*3/uL (ref 150–400)
RBC: 4.08 MIL/uL (ref 3.87–5.11)
RDW: 13.8 % (ref 11.5–15.5)
WBC: 5.1 10*3/uL (ref 4.0–10.5)
nRBC: 0 % (ref 0.0–0.2)

## 2019-06-19 LAB — I-STAT BETA HCG BLOOD, ED (MC, WL, AP ONLY): I-stat hCG, quantitative: <5 m[IU]/mL (ref <5)

## 2019-06-19 LAB — BASIC METABOLIC PANEL
Anion gap: 9 (ref 5–15)
BUN: 9 mg/dL (ref 6–20)
CO2: 25 mmol/L (ref 22–32)
Calcium: 9.3 mg/dL (ref 8.9–10.3)
Chloride: 103 mmol/L (ref 98–111)
Creatinine, Ser: 0.64 mg/dL (ref 0.44–1.00)
GFR calc Af Amer: >60 mL/min (ref >60)
GFR calc non Af Amer: >60 mL/min (ref >60)
Glucose, Bld: 115 mg/dL — ABNORMAL HIGH (ref 70–99)
Potassium: 3.8 mmol/L (ref 3.5–5.1)
Sodium: 137 mmol/L (ref 135–145)

## 2019-06-19 LAB — TROPONIN I (HIGH SENSITIVITY)
Troponin I (High Sensitivity): <2 ng/L (ref <18)
Troponin I (High Sensitivity): <2 ng/L (ref <18)

## 2019-06-19 MED ORDER — AMOXICILLIN-POT CLAVULANATE 875-125 MG PO TABS: 1.0000 | ORAL_TABLET | Freq: Two times a day (BID) | ORAL | 0 refills | Status: DC

## 2019-06-19 MED ORDER — PREDNISONE 10 MG PO TABS: 40.0000 mg | ORAL_TABLET | Freq: Every day | ORAL | 0 refills | Status: AC

## 2019-06-19 MED ORDER — FLUTICASONE PROPIONATE 50 MCG/ACT NA SUSP: 1.0000 | Freq: Every day | NASAL | 0 refills | Status: DC

## 2019-06-19 MED ORDER — AEROCHAMBER PLUS FLO-VU MEDIUM MISC
1.0000 | Freq: Once | Status: AC
  Administered 2019-06-19: 18:00:00 1
  Filled 2019-06-19: qty 1

## 2019-06-19 MED ORDER — ALBUTEROL SULFATE HFA 108 (90 BASE) MCG/ACT IN AERS
1.0000 | INHALATION_SPRAY | Freq: Once | RESPIRATORY_TRACT | Status: AC
  Administered 2019-06-19: 18:00:00 1 via RESPIRATORY_TRACT
  Filled 2019-06-19: qty 6.7

## 2019-06-19 MED ORDER — SODIUM CHLORIDE 0.9% FLUSH: 3.0000 mL | Freq: Once | INTRAVENOUS | Status: DC

## 2019-06-19 MED ORDER — AEROCHAMBER PLUS FLO-VU LARGE MISC
Status: AC
  Filled 2019-06-19: qty 1

## 2019-06-19 NOTE — ED Provider Notes (Signed)
[facility] EMERGENCY DEPARTMENT Provider Note   CSN: [id number] Arrival date & time: 06/19/19  1257     History   Chief Complaint Chief Complaint  Patient presents with   Nasal Congestion   Shortness of Breath   Chest Pain    HPI Mary Glover is a 18 y.o. female who presents to ED for 2-week history of chest tightness, shortness of breath, sinus pain and pressure and nasal congestion.  She was seen and evaluated in urgent care when she complained of sore throat and had Covid testing done and given anti-inflammatories.  Covid test was negative.  Her symptoms began shortly after that visit.  Denies any sick contacts with similar symptoms.  She denies any leg swelling, recent immobilization, hemoptysis, history of DVT, PE, OCP use, possibility of pregnancy.     HPI  Past Medical History:  Diagnosis Date   Allergy     Patient Active Problem List   Diagnosis Date Noted   Nasal sinus congestion 01/12/2019   Patellofemoral pain syndrome of both knees 09/23/2017   Hypermobility syndrome 09/23/2017    No past surgical history on file.   OB History   No obstetric history on file.      Home Medications    Prior to Admission medications   Medication Sig Start Date End Date Taking? Authorizing Provider  amoxicillin-clavulanate (AUGMENTIN) 875-125 MG tablet Take 1 tablet by mouth every 12 (twelve) hours. 06/19/19   Gianelle Mccaul, PA-C  Azelastine HCl 137 MCG/SPRAY SOLN Place 2 sprays into the nose daily. Each nostril, USE WITH FLONASE. 01/17/19   Gildardo Pounds, NP  azithromycin (ZITHROMAX) 250 MG tablet Take 2 today then 1 daily 01/18/19   Argentina Donovan, PA-C  cetirizine (ZYRTEC) 10 MG tablet Take 1 tablet (10 mg total) by mouth daily. 09/13/18   Argentina Donovan, PA-C  fluticasone (FLONASE) 50 MCG/ACT nasal spray Place 1 spray into both nostrils daily. 06/19/19   Clydell Alberts, PA-C  ibuprofen (ADVIL) 800 MG tablet Take 1 tablet (800 mg total)  by mouth every 8 (eight) hours as needed. 05/31/19   Sharion Balloon, NP  predniSONE (DELTASONE) 10 MG tablet Take 4 tablets (40 mg total) by mouth daily for 5 days. 06/19/19 06/24/19  Mary Brashear, PA-C  Vitamin D, Ergocalciferol, (DRISDOL) 1.25 MG (50000 UT) CAPS capsule Take 1 capsule (50,000 Units total) by mouth every 7 (seven) days for 90 doses. 11/06/18 07/22/20  Gildardo Pounds, NP    Family History Family History  Problem Relation Age of Onset   Diabetes Mother    Hypertension Father     Social History Social History   Tobacco Use   Smoking status: Never Smoker   Smokeless tobacco: Never Used  Substance Use Topics   Alcohol use: No   Drug use: No     Allergies   Patient has no known allergies.   Review of Systems Review of Systems  Constitutional: Negative for appetite change, chills and fever.  HENT: Positive for sinus pressure and sinus pain. Negative for ear pain, rhinorrhea, sneezing and sore throat.   Eyes: Negative for photophobia and visual disturbance.  Respiratory: Positive for cough, chest tightness and shortness of breath. Negative for wheezing.   Cardiovascular: Negative for chest pain and palpitations.  Gastrointestinal: Negative for abdominal pain, blood in stool, constipation, diarrhea, nausea and vomiting.  Genitourinary: Negative for dysuria, hematuria and urgency.  Musculoskeletal: Negative for myalgias.  Skin: Negative for rash.  Neurological: Negative for  dizziness, weakness and light-headedness.     Physical Exam Updated Vital Signs BP 110/83 (BP Location: Right Arm)    Pulse 88    Temp 98.7 F (37.1 C) (Oral)    Resp 20    SpO2 99%   Physical Exam Vitals signs and nursing note reviewed.  Constitutional:      General: She is not in acute distress.    Appearance: She is well-developed.  HENT:     Head: Normocephalic and atraumatic.     Nose:     Right Sinus: Maxillary sinus tenderness present.     Left Sinus: Maxillary sinus  tenderness present.  Eyes:     General: No scleral icterus.       Left eye: No discharge.     Conjunctiva/sclera: Conjunctivae normal.  Neck:     Musculoskeletal: Normal range of motion and neck supple.  Cardiovascular:     Rate and Rhythm: Normal rate and regular rhythm.     Heart sounds: Normal heart sounds. No murmur. No friction rub. No gallop.   Pulmonary:     Effort: Pulmonary effort is normal. No respiratory distress.     Breath sounds: Examination of the right-lower field reveals wheezing. Examination of the left-lower field reveals wheezing. Wheezing present.  Chest:     Chest wall: Tenderness present.    Abdominal:     General: Bowel sounds are normal. There is no distension.     Palpations: Abdomen is soft.     Tenderness: There is no abdominal tenderness. There is no guarding.  Musculoskeletal: Normal range of motion.     Comments: No lower extremity edema, erythema or calf tenderness bilaterally.  Skin:    General: Skin is warm and dry.     Findings: No rash.  Neurological:     Mental Status: She is alert.     Motor: No abnormal muscle tone.     Coordination: Coordination normal.      ED Treatments / Results  Labs (all labs ordered are listed, but only abnormal results are displayed) Labs Reviewed  BASIC METABOLIC PANEL - Abnormal; Notable for the following components:      Result Value   Glucose, Bld 115 (*)    All other components within normal limits  CBC - Abnormal; Notable for the following components:   Hemoglobin 11.8 (*)    All other components within normal limits  I-STAT BETA HCG BLOOD, ED ([facility], [facility], [facility] ONLY)  TROPONIN I (HIGH SENSITIVITY)  TROPONIN I (HIGH SENSITIVITY)    EKG EKG Interpretation  Date/Time:  Tuesday June 19 2019 13:00:36 EST Ventricular Rate:  85 PR Interval:  152 QRS Duration: 74 QT Interval:  330 QTC Calculation: 392 R Axis:   53 Text Interpretation: Normal sinus rhythm Nonspecific T wave abnormality Abnormal ECG  No significant change since last tracing Confirmed by Richardean CanalYao, David H [id number]) on 06/19/2019 4:54:33 PM   Radiology Dg Chest 2 View  Result Date: 06/19/2019 CLINICAL DATA:  Chest pain. EXAM: CHEST - 2 VIEW COMPARISON:  June 09, 2014. FINDINGS: The heart size and mediastinal contours are within normal limits. Both lungs are clear. No pneumothorax or pleural effusion is noted. The visualized skeletal structures are unremarkable. IMPRESSION: No active cardiopulmonary disease. Electronically Signed   By: Lupita RaiderJames  Green Jr M.D.   On: 06/19/2019 13:39    Procedures Procedures (including critical care time)  Medications Ordered in ED Medications  sodium chloride flush (NS) 0.9 % injection 3 mL (has no administration  in time range)  albuterol (VENTOLIN HFA) 108 (90 Base) MCG/ACT inhaler 1 puff (has no administration in time range)  AeroChamber Plus Flo-Vu Medium MISC 1 each (has no administration in time range)     Initial Impression / Assessment and Plan / ED Course  I have reviewed the triage vital signs and the nursing notes.  Pertinent labs & imaging results that were available during my care of the patient were reviewed by me and considered in my medical decision making (see chart for details).        18 year old female presenting to the ED with a chief complaint of chest tightness, shortness of breath, cough, sinus pain and pressure for the past 2 weeks.  She denies any sick contacts with similar symptoms, history of DVT, PE or MI.  On exam she is overall well-appearing.  No lower extremity edema, erythema or calf tenderness that would concern me for DVT.  She denies any OCP use.  Her vital signs within normal limits, she is not tachycardic, tachypneic or hypoxic.  Her lab work including initial delta troponin, CBC, BMP is unremarkable.  EKG shows normal sinus rhythm.  Chest x-ray is unremarkable.  She had some wheezing noted on exam as well as sinus tenderness palpation so we will treat for  bronchitis and sinusitis.  Patient agreeable to the plan.  We will have her return for worsening symptoms.  Patient is hemodynamically stable, in NAD, and able to ambulate in the ED. Evaluation does not show pathology that would require ongoing emergent intervention or inpatient treatment. I explained the diagnosis to the patient. Pain has been managed and has no complaints prior to discharge. Patient is comfortable with above plan and is stable for discharge at this time. All questions were answered prior to disposition. Strict return precautions for returning to the ED were discussed. Encouraged follow up with PCP.   An After Visit Summary was printed and given to the patient.   Portions of this note were generated with Scientist, clinical (histocompatibility and immunogenetics). Dictation errors may occur despite best attempts at proofreading.   Final Clinical Impressions(s) / ED Diagnoses   Final diagnoses:  Viral URI with cough  Acute non-recurrent maxillary sinusitis    ED Discharge Orders         Ordered    predniSONE (DELTASONE) 10 MG tablet  Daily     06/19/19 1735    amoxicillin-clavulanate (AUGMENTIN) 875-125 MG tablet  Every 12 hours     06/19/19 1735    fluticasone (FLONASE) 50 MCG/ACT nasal spray  Daily     06/19/19 1735           Dietrich Pates, PA-C 06/19/19 1736    Charlynne Pander, MD 06/19/19 1954

## 2019-06-19 NOTE — Discharge Instructions (Signed)
Return to the ED if you start to experience worsening symptoms, trouble breathing or trouble swallowing, worsening chest pain, leg swelling or coughing up blood.

## 2019-06-19 NOTE — ED Triage Notes (Signed)
Pt here for chest tightness, shob, and nasal congestion x a few weeks. Pt trying to get in with PCP but cannot get an in person visit. Sts she's having to sleep sitting up at night d/t nasal congestion. Also feels like her heart is beating out of her chest.

## 2019-07-02 ENCOUNTER — Ambulatory Visit: Payer: Medicaid Other | Attending: Family Medicine | Admitting: Family Medicine

## 2019-07-02 ENCOUNTER — Other Ambulatory Visit: Payer: Self-pay

## 2019-07-02 ENCOUNTER — Other Ambulatory Visit: Payer: Self-pay | Admitting: Family Medicine

## 2019-07-02 DIAGNOSIS — J328 Other chronic sinusitis: Secondary | ICD-10-CM | POA: Diagnosis not present

## 2019-07-02 MED ORDER — ALBUTEROL SULFATE HFA 108 (90 BASE) MCG/ACT IN AERS: 2.0000 | INHALATION_SPRAY | Freq: Four times a day (QID) | RESPIRATORY_TRACT | 1 refills | Status: DC | PRN

## 2019-07-02 MED ORDER — PREDNISONE 20 MG PO TABS: 20.0000 mg | ORAL_TABLET | Freq: Two times a day (BID) | ORAL | 0 refills | Status: DC

## 2019-07-02 NOTE — Progress Notes (Signed)
Virtual Visit via Telephone Note  I connected with Andre Lefort, on 07/02/2019 at 3:28 PM by telephone due to the COVID-19 pandemic and verified that I am speaking with the correct person using two identifiers.   Consent: I discussed the limitations, risks, security and privacy concerns of performing an evaluation and management service by telephone and the availability of in person appointments. I also discussed with the patient that there may be a patient responsible charge related to this service. The patient expressed understanding and agreed to proceed.   Location of Patient: Work  Government social research officer of Provider: Clinic   Persons participating in Telemedicine visit: Myiah Petkus Farrington-CMA Dr. Alvis Lemmings     History of Present Illness: 18 year old female who presents today for follow-up from the ED where she was managed for viral URI with cough 13 days ago and placed on Augmentin and prednisone. 2 weeks prior to that she was seen for pharyngitis and tested negative for SARS-CoV-2.   She is not done with her course of Augmentin and Prednisone but is still symptomatic with her nose burning at other times she has intermittent nasal bleeds, dyspnea and when she takes a short walk on her way to work she gets dyspneic, her throat gets dried.Symptoms occur during the day but is worse at night. Her nose sometimes bleeds. Her tonsils were previously swollen but this has resolved and she informs me she has had several episodes of tonsillitis and this is frustrating.   Denies presence of fever She would like to be referred to a specialist.  Past Medical History:  Diagnosis Date  . Allergy    No Known Allergies  Current Outpatient Medications on File Prior to Visit  Medication Sig Dispense Refill  . Azelastine HCl 137 MCG/SPRAY SOLN Place 2 sprays into the nose daily. Each nostril, USE WITH FLONASE. 1 Bottle 0  . fluticasone (FLONASE) 50 MCG/ACT nasal spray Place 1 spray into  both nostrils daily. 16 g 0  . ibuprofen (ADVIL) 800 MG tablet Take 1 tablet (800 mg total) by mouth every 8 (eight) hours as needed. 21 tablet 0  . Vitamin D, Ergocalciferol, (DRISDOL) 1.25 MG (50000 UT) CAPS capsule Take 1 capsule (50,000 Units total) by mouth every 7 (seven) days for 90 doses. 12 capsule 1  . amoxicillin-clavulanate (AUGMENTIN) 875-125 MG tablet Take 1 tablet by mouth every 12 (twelve) hours. (Patient not taking: Reported on 07/02/2019) 14 tablet 0  . azithromycin (ZITHROMAX) 250 MG tablet Take 2 today then 1 daily (Patient not taking: Reported on 07/02/2019) 6 tablet 0  . cetirizine (ZYRTEC) 10 MG tablet Take 1 tablet (10 mg total) by mouth daily. (Patient not taking: Reported on 07/02/2019) 30 tablet 11   No current facility-administered medications on file prior to visit.     Observations/Objective: Awake, alert, oriented x3. Not in acute distress  Assessment and Plan: 1. Other chronic sinusitis Presence of nasal bleeds could be from nasal spray Educated on nasal irrigation We will repeat course of prednisone Albuterol MDI for dyspnea. - Ambulatory referral to ENT - predniSONE (DELTASONE) 20 MG tablet; Take 1 tablet (20 mg total) by mouth 2 (two) times daily with a meal.  Dispense: 10 tablet; Refill: 0 - albuterol (VENTOLIN HFA) 108 (90 Base) MCG/ACT inhaler; Inhale 2 puffs into the lungs every 6 (six) hours as needed for wheezing or shortness of breath. Dx: Dyspnea, impending bronchitis  Dispense: 18 g; Refill: 1   Follow Up Instructions: Return if symptoms worsen or fail to improve.  I discussed the assessment and treatment plan with the patient. The patient was provided an opportunity to ask questions and all were answered. The patient agreed with the plan and demonstrated an understanding of the instructions.   The patient was advised to call back or seek an in-person evaluation if the symptoms worsen or if the condition fails to improve as  anticipated.     I provided 15 minutes total of non-face-to-face time during this encounter including median intraservice time, reviewing previous notes, labs, imaging, medications, management and patient verbalized understanding.     Charlott Rakes, MD, FAAFP. [facility] and [facility] [place], [place]   07/02/2019, 3:28 PM

## 2019-07-02 NOTE — Progress Notes (Signed)
Patient has been called and DOB has been verified. Patient has been screened and transferred to PCP to start phone visit.     

## 2019-07-03 ENCOUNTER — Encounter: Payer: Self-pay | Admitting: Family Medicine

## 2019-08-09 ENCOUNTER — Other Ambulatory Visit: Payer: Self-pay

## 2019-08-09 ENCOUNTER — Ambulatory Visit: Payer: Medicaid Other | Attending: Nurse Practitioner | Admitting: *Deleted

## 2019-08-09 DIAGNOSIS — Z309 Encounter for contraceptive management, unspecified: Secondary | ICD-10-CM

## 2019-08-09 MED ORDER — MEDROXYPROGESTERONE ACETATE 150 MG/ML IM SUSP
150.0000 mg | Freq: Once | INTRAMUSCULAR | Status: AC
  Administered 2019-08-09: 10:00:00 150 mg via INTRAMUSCULAR

## 2019-08-09 NOTE — Progress Notes (Signed)
Date last pap:May. Last Depo-Provera:October 22,2020 Side Effects if BOF:BPZW HCG indicated?no Depo-Provera 150 mg IM given left upper outer quadrant byT.Biviana Saddler,RN Next appointment dueMarch 25-Apr 8 Patient verbalized understanding.

## 2019-09-03 ENCOUNTER — Other Ambulatory Visit: Payer: Self-pay

## 2019-09-03 ENCOUNTER — Encounter (HOSPITAL_COMMUNITY): Payer: Self-pay

## 2019-09-03 ENCOUNTER — Ambulatory Visit (HOSPITAL_COMMUNITY)
Admission: EM | Admit: 2019-09-03 | Discharge: 2019-09-03 | Disposition: A | Discharge status: Home / Self Care | Payer: Medicaid Other | Attending: Family Medicine | Admitting: Family Medicine

## 2019-09-03 DIAGNOSIS — Z0189 Encounter for other specified special examinations: Secondary | ICD-10-CM

## 2019-09-03 DIAGNOSIS — Z20822 Contact with and (suspected) exposure to covid-19: Secondary | ICD-10-CM | POA: Diagnosis not present

## 2019-09-03 DIAGNOSIS — Z20828 Contact with and (suspected) exposure to other viral communicable diseases: Secondary | ICD-10-CM

## 2019-09-03 NOTE — Discharge Instructions (Addendum)
You have been tested for COVID-19 today. °If your test returns positive, you will receive a phone call from [facility] regarding your results. °Negative test results are not called. °Both positive and negative results area always visible on MyChart. °If you do not have a MyChart account, sign up instructions are provided in your discharge papers. °Please do not hesitate to contact us should you have questions or concerns. ° °

## 2019-09-03 NOTE — ED Provider Notes (Signed)
[facility]   [id number] 09/03/19 Arrival Time: 0539  ASSESSMENT & PLAN:  1. Patient request for diagnostic testing     COVID-19 testing sent. See letter/work note on file for self-isolation guidelines.  Follow-up Information    Gildardo Pounds, NP.   Specialty: Nurse Practitioner Why: As needed. Contact information: [address] [place] [address] [phone]           Reviewed expectations re: course of current medical issues. Questions answered. Outlined signs and symptoms indicating need for more acute intervention. Patient verbalized understanding. After Visit Summary given.   SUBJECTIVE: History from: patient. Mary Glover is a 19 y.o. female who requests COVID-19 testing. Known COVID-19 contact: none. Recent travel: none. Denies: runny nose, congestion, fever, cough, sore throat, difficulty breathing and headache. Normal PO intake without n/v/d.   OBJECTIVE:  Vitals:   09/03/19 1120  BP: 119/78  Pulse: 74  Resp: 18  Temp: 98.4 F (36.9 C)  TempSrc: Oral  SpO2: 100%    General appearance: alert; no distress Eyes: PERRLA; EOMI; conjunctiva normal HENT: [place]; AT; nasal mucosa normal; oral mucosa normal Neck: supple  Lungs: speaks full sentences without difficulty; unlabored Extremities: no edema Skin: warm and dry Neurologic: normal gait Psychological: alert and cooperative; normal mood and affect  Labs:  Labs Reviewed  NOVEL CORONAVIRUS, NAA (HOSP ORDER, SEND-OUT TO REF LAB; TAT 18-24 HRS)      No Known Allergies  Past Medical History:  Diagnosis Date  . Allergy    Social History   Socioeconomic History  . Marital status: Single    Spouse name: Not on file  . Number of children: Not on file  . Years of education: Not on file  . Highest education level: Not on file  Occupational History  . Not on file  Tobacco Use  . Smoking status: Never Smoker  . Smokeless tobacco: Never Used  Substance and Sexual  Activity  . Alcohol use: No  . Drug use: No  . Sexual activity: Yes    Birth control/protection: Injection  Other Topics Concern  . Not on file  Social History Narrative  . Not on file   Social Determinants of Health   Financial Resource Strain:   . Difficulty of Paying Living Expenses: Not on file  Food Insecurity:   . Worried About Charity fundraiser in the Last Year: Not on file  . Ran Out of Food in the Last Year: Not on file  Transportation Needs:   . Lack of Transportation (Medical): Not on file  . Lack of Transportation (Non-Medical): Not on file  Physical Activity:   . Days of Exercise per Week: Not on file  . Minutes of Exercise per Session: Not on file  Stress:   . Feeling of Stress : Not on file  Social Connections:   . Frequency of Communication with Friends and Family: Not on file  . Frequency of Social Gatherings with Friends and Family: Not on file  . Attends Religious Services: Not on file  . Active Member of Clubs or Organizations: Not on file  . Attends Archivist Meetings: Not on file  . Marital Status: Not on file  Intimate Partner Violence:   . Fear of Current or Ex-Partner: Not on file  . Emotionally Abused: Not on file  . Physically Abused: Not on file  . Sexually Abused: Not on file   Family History  Problem Relation Age of Onset  . Diabetes Mother   .  Hypertension Father    History reviewed. No pertinent surgical history.   Mardella Layman, MD 09/03/19 1226

## 2019-09-03 NOTE — ED Triage Notes (Signed)
Pt presents for covid test with no symptoms. 

## 2019-09-05 LAB — NOVEL CORONAVIRUS, NAA (HOSP ORDER, SEND-OUT TO REF LAB; TAT 18-24 HRS): SARS-CoV-2, NAA: NOT DETECTED

## 2019-10-23 ENCOUNTER — Ambulatory Visit: Payer: Medicaid Other | Attending: Nurse Practitioner

## 2019-10-23 ENCOUNTER — Other Ambulatory Visit: Payer: Self-pay

## 2019-10-26 ENCOUNTER — Ambulatory Visit: Payer: Medicaid Other

## 2019-10-30 ENCOUNTER — Ambulatory Visit: Payer: Medicaid Other | Attending: Nurse Practitioner

## 2019-10-30 ENCOUNTER — Other Ambulatory Visit: Payer: Self-pay

## 2019-10-30 DIAGNOSIS — Z309 Encounter for contraceptive management, unspecified: Secondary | ICD-10-CM

## 2019-10-30 MED ORDER — MEDROXYPROGESTERONE ACETATE 150 MG/ML IM SUSP
150.0000 mg | Freq: Once | INTRAMUSCULAR | Status: AC
  Administered 2019-10-30: 150 mg via INTRAMUSCULAR

## 2019-10-30 NOTE — Progress Notes (Signed)
Patient arrived at clinic on time for Depo shot. Last depo:08/09/2019 Patient was given depo shot in right ventrogluteal.  Next appointment:June2021

## 2020-01-16 ENCOUNTER — Ambulatory Visit: Payer: Medicaid Other | Attending: Nurse Practitioner

## 2020-01-16 ENCOUNTER — Other Ambulatory Visit: Payer: Self-pay

## 2020-01-16 VITALS — Temp 98.1°F

## 2020-01-16 DIAGNOSIS — Z309 Encounter for contraceptive management, unspecified: Secondary | ICD-10-CM | POA: Diagnosis not present

## 2020-01-16 MED ORDER — MEDROXYPROGESTERONE ACETATE 150 MG/ML IM SUSP
150.0000 mg | Freq: Once | INTRAMUSCULAR | Status: AC
  Administered 2020-01-16: 150 mg via INTRAMUSCULAR

## 2020-01-16 NOTE — Progress Notes (Signed)
DOB and Name verified Pt is here for her DEPO injections/ Last Depo-Provera: 10/30/2019 within time interval  Side Effects if any: none verbalized Serum HCG indicated?NA Depo-Provera 150 mg IM given in L   Gluteus medius  muscle . Injection well tolerated. No reaction noted at the injection site. Explained pt the importance adherence to depo provera perpetual calendar. Education given to pt in regards to use extra protection and possible false negative HGC if antibiotic intake or missed intervals.Verbalized understanding Next shot scheduled on 04/02/2020

## 2020-01-31 ENCOUNTER — Encounter: Payer: Self-pay | Admitting: Physician Assistant

## 2020-01-31 ENCOUNTER — Ambulatory Visit: Payer: Medicaid Other | Attending: Physician Assistant | Admitting: Physician Assistant

## 2020-01-31 ENCOUNTER — Other Ambulatory Visit: Payer: Self-pay

## 2020-01-31 DIAGNOSIS — R0981 Nasal congestion: Secondary | ICD-10-CM

## 2020-01-31 NOTE — Progress Notes (Signed)
Virtual Visit via Telephone Note  I connected with Mary Glover on 01/31/20 at  9:50 AM EDT by telephone and verified that I am speaking with the correct person using two identifiers.   I discussed the limitations, risks, security and privacy concerns of performing an evaluation and management service by telephone and the availability of in person appointments. I also discussed with the patient that there may be a patient responsible charge related to this service. The patient expressed understanding and agreed to proceed.  PATIENT visit by telephone virtually in the context of Covid-19 pandemic. Patient location:  home My Location:  [facility] office Persons on the call:  Me and the patient   History of Present Illness:  Patient is c/o chronic nasal congestion and can't breathe.  Prednisone, flonase and antibiotics have not helped.  Last had a virtual visit with her PCP in November 2020.  Expresses being very upset she was not seen in person and never heard about referral.  No fever.  No sinus pain.  No discolored drainage.     Observations/Objective:  NAD.  A&Ox3   Assessment and Plan: 1. Nasal sinus congestion Failed prednisone, antibiotics, and nasal spray - Ambulatory referral to ENT    Follow Up Instructions: See PCP in 3 months   I discussed the assessment and treatment plan with the patient. The patient was provided an opportunity to ask questions and all were answered. The patient agreed with the plan and demonstrated an understanding of the instructions.   The patient was advised to call back or seek an in-person evaluation if the symptoms worsen or if the condition fails to improve as anticipated.  I provided 15 minutes of non-face-to-face time during this encounter.   Georgian Co, PA-C  Patient ID: Mary Glover, female   DOB: [birth year], 19 y.o.   MRN: [id number]

## 2020-02-13 DIAGNOSIS — J310 Chronic rhinitis: Secondary | ICD-10-CM | POA: Diagnosis not present

## 2020-02-13 DIAGNOSIS — J343 Hypertrophy of nasal turbinates: Secondary | ICD-10-CM | POA: Diagnosis not present

## 2020-02-18 ENCOUNTER — Other Ambulatory Visit: Payer: Self-pay | Admitting: Otolaryngology

## 2020-02-18 DIAGNOSIS — J329 Chronic sinusitis, unspecified: Secondary | ICD-10-CM

## 2020-02-20 ENCOUNTER — Ambulatory Visit
Admission: RE | Admit: 2020-02-20 | Discharge: 2020-02-20 | Disposition: A | Discharge status: Home / Self Care | Payer: Medicaid Other | Source: Ambulatory Visit | Attending: Otolaryngology | Admitting: Otolaryngology

## 2020-02-20 DIAGNOSIS — J329 Chronic sinusitis, unspecified: Secondary | ICD-10-CM

## 2020-02-22 ENCOUNTER — Ambulatory Visit (HOSPITAL_COMMUNITY)
Admission: EM | Admit: 2020-02-22 | Discharge: 2020-02-22 | Disposition: A | Discharge status: Home / Self Care | Payer: Medicaid Other | Attending: Family Medicine | Admitting: Family Medicine

## 2020-02-22 ENCOUNTER — Encounter (HOSPITAL_COMMUNITY): Payer: Self-pay | Admitting: Emergency Medicine

## 2020-02-22 ENCOUNTER — Other Ambulatory Visit: Payer: Self-pay

## 2020-02-22 DIAGNOSIS — M25561 Pain in right knee: Secondary | ICD-10-CM

## 2020-02-22 DIAGNOSIS — K5900 Constipation, unspecified: Secondary | ICD-10-CM

## 2020-02-22 DIAGNOSIS — Z3202 Encounter for pregnancy test, result negative: Secondary | ICD-10-CM

## 2020-02-22 DIAGNOSIS — N390 Urinary tract infection, site not specified: Secondary | ICD-10-CM | POA: Diagnosis not present

## 2020-02-22 LAB — POCT URINALYSIS DIP (DEVICE)
Glucose, UA: NEGATIVE mg/dL
Hgb urine dipstick: NEGATIVE
Ketones, ur: 80 mg/dL — AB
Leukocytes,Ua: NEGATIVE
Nitrite: NEGATIVE
Protein, ur: NEGATIVE mg/dL
Specific Gravity, Urine: >=1.03 (ref 1.005–1.030)
Urobilinogen, UA: 0.2 mg/dL (ref 0.0–1.0)
pH: 5.5 (ref 5.0–8.0)

## 2020-02-22 LAB — POC URINE PREG, ED: Preg Test, Ur: NEGATIVE

## 2020-02-22 MED ORDER — MAGNESIUM CITRATE PO SOLN: 1.0000 | Freq: Once | ORAL | 0 refills | Status: AC

## 2020-02-22 MED ORDER — ONDANSETRON HCL 4 MG PO TABS: 4.0000 mg | ORAL_TABLET | Freq: Four times a day (QID) | ORAL | 0 refills | Status: DC

## 2020-02-22 NOTE — ED Triage Notes (Signed)
Pt c/o abd pain with constipation onset 5 days ago. Pt states last bowel movement was amount 4 days ago. She is able to pass some small stools but not a complete BM. Pt states she has felt nauseous and is afraid to eat. Pt also c/o possible uti with frequency and urgency but only able to urinate a little. Pt also c/o right knee pain that hurts when she is waking up. She has no problems with walking to exam room. She states it just hurts all the time.

## 2020-02-22 NOTE — Discharge Instructions (Signed)
Take aleve 2 tabs morning and night with food- for knee pain Do daily knee exercises  Take the magnesium citrate once- this is a laxative Drink plenty of water Take nausea medicine if needed

## 2020-02-22 NOTE — ED Provider Notes (Signed)
[facility]    CSN: [id number] Arrival date & time: 02/22/20  1045      History   Chief Complaint Chief Complaint  Patient presents with  . Constipation  . Urinary Tract Infection    HPI Mary Glover is a 19 y.o. female.   HPI  Patient is here for constipation.  Its causing her to feel distended.  She states that when she tries to drink, since yesterday, much of which she drinks comes back up.  She thinks her "stomach is full".  She states she only has small hard stool passing, infrequently.  She does not usually have constipation.  No new foods.  No new medicines. States she is having difficulty passing urine.  She is drinking enough water but she feels like it is hard to empty her bladder.  No dysuria.  No fever. Patient states that she has chronic right knee pain.  She has been told is because of her kneecap.  She has not been told, however, how to treat it or reduce the pain  Past Medical History:  Diagnosis Date  . Allergy     Patient Active Problem List   Diagnosis Date Noted  . Nasal sinus congestion 01/12/2019  . Patellofemoral pain syndrome of both knees 09/23/2017  . Hypermobility syndrome 09/23/2017    History reviewed. No pertinent surgical history.  OB History   No obstetric history on file.      Home Medications    Prior to Admission medications   Medication Sig Start Date End Date Taking? Authorizing Provider  albuterol (VENTOLIN HFA) 108 (90 Base) MCG/ACT inhaler Inhale 2 puffs into the lungs every 6 (six) hours as needed for wheezing or shortness of breath. Dx: Dyspnea, impending bronchitis 07/02/19  Yes Newlin, Enobong, MD  Azelastine HCl 137 MCG/SPRAY SOLN Place 2 sprays into the nose daily. Each nostril, USE WITH FLONASE. 01/17/19  Yes Claiborne Rigg, NP  fluticasone (FLONASE) 50 MCG/ACT nasal spray Place 1 spray into both nostrils daily. 06/19/19  Yes Khatri, Hina, PA-C  ibuprofen (ADVIL) 800 MG tablet Take 1 tablet (800 mg  total) by mouth every 8 (eight) hours as needed. 05/31/19  Yes Mickie Bail, NP  Vitamin D, Ergocalciferol, (DRISDOL) 1.25 MG (50000 UT) CAPS capsule Take 1 capsule (50,000 Units total) by mouth every 7 (seven) days for 90 doses. 11/06/18 07/22/20 Yes Claiborne Rigg, NP  cetirizine (ZYRTEC) 10 MG tablet Take 1 tablet (10 mg total) by mouth daily. Patient not taking: Reported on 07/02/2019 09/13/18   Anders Simmonds, PA-C  magnesium citrate SOLN Take 296 mLs (1 Bottle total) by mouth once for 1 dose. 02/22/20 02/22/20  Eustace Moore, MD  ondansetron (ZOFRAN) 4 MG tablet Take 1-2 tablets (4-8 mg total) by mouth every 6 (six) hours. As needed nausea 02/22/20   Eustace Moore, MD    Family History Family History  Problem Relation Age of Onset  . Diabetes Mother   . Hypertension Father     Social History Social History   Tobacco Use  . Smoking status: Never Smoker  . Smokeless tobacco: Never Used  Vaping Use  . Vaping Use: Never used  Substance Use Topics  . Alcohol use: No  . Drug use: No     Allergies   Patient has no known allergies.   Review of Systems Review of Systems See HPI  Physical Exam Triage Vital Signs ED Triage Vitals  Enc Vitals Group     BP  02/22/20 1128 (!) 117/64     Pulse Rate 02/22/20 1128 68     Resp 02/22/20 1128 14     Temp 02/22/20 1128 99.4 F (37.4 C)     Temp Source 02/22/20 1128 Oral     SpO2 02/22/20 1128 100 %     Weight --      Height --      Head Circumference --      Peak Flow --      Pain Score 02/22/20 1121 7     Pain Loc --      Pain Edu? --      Excl. in GC? --    No data found.  Updated Vital Signs BP (!) 117/64 (BP Location: Left Arm)   Pulse 68   Temp 99.4 F (37.4 C) (Oral)   Resp 14   SpO2 100%   Visual Acuity Right Eye Distance:   Left Eye Distance:   Bilateral Distance:    Right Eye Near:   Left Eye Near:    Bilateral Near:     Physical Exam Constitutional:      General: She is not in acute  distress.    Appearance: Normal appearance. She is well-developed and normal weight.  HENT:     Head: Normocephalic and atraumatic.  Eyes:     Conjunctiva/sclera: Conjunctivae normal.     Pupils: Pupils are equal, round, and reactive to light.  Cardiovascular:     Rate and Rhythm: Normal rate.  Pulmonary:     Effort: Pulmonary effort is normal. No respiratory distress.  Abdominal:     General: Abdomen is flat. Bowel sounds are normal. There is no distension.     Palpations: Abdomen is soft. There is no mass.     Tenderness: There is no abdominal tenderness.     Comments: Abdomen exam is benign  Musculoskeletal:        General: Normal range of motion.     Cervical back: Normal range of motion.     Comments: Right knee does have good range of motion.  No instability.  Pain with patellofemoral grind testing  Skin:    General: Skin is warm and dry.  Neurological:     General: No focal deficit present.     Mental Status: She is alert and oriented to person, place, and time.  Psychiatric:        Mood and Affect: Mood normal.        Behavior: Behavior normal.      UC Treatments / Results  Labs (all labs ordered are listed, but only abnormal results are displayed) Labs Reviewed  POCT URINALYSIS DIP (DEVICE) - Abnormal; Notable for the following components:      Result Value   Bilirubin Urine SMALL (*)    Ketones, ur 80 (*)    All other components within normal limits  POC URINE PREG, ED    EKG   Radiology No results found.  Procedures Procedures (including critical care time)  Medications Ordered in UC Medications - No data to display  Initial Impression / Assessment and Plan / UC Course  I have reviewed the triage vital signs and the nursing notes.  Pertinent labs & imaging results that were available during my care of the patient were reviewed by me and considered in my medical decision making (see chart for details).     Gust with patient MiraLAX.  She wants  something that is going to work quickly.  Other than  send her prescription for mag citrate.  Told to drink half the bottle, and then half a bottle later if she was unsuccessful. We discussed quadricep exercises for strengthening to help with the patellofemoral pain.  Anti-inflammatories.  Ice.  Brace. Final Clinical Impressions(s) / UC Diagnoses   Final diagnoses:  Constipation, unspecified constipation type  Anterior knee pain, right     Discharge Instructions     Take aleve 2 tabs morning and night with food- for knee pain Do daily knee exercises  Take the magnesium citrate once- this is a laxative Drink plenty of water Take nausea medicine if needed   ED Prescriptions    Medication Sig Dispense Auth. Provider   magnesium citrate SOLN Take 296 mLs (1 Bottle total) by mouth once for 1 dose. 296 mL Eustace Moore, MD   ondansetron (ZOFRAN) 4 MG tablet Take 1-2 tablets (4-8 mg total) by mouth every 6 (six) hours. As needed nausea 12 tablet Eustace Moore, MD     PDMP not reviewed this encounter.   Eustace Moore, MD 02/22/20 [phone]

## 2020-03-13 ENCOUNTER — Emergency Department (HOSPITAL_COMMUNITY)
Admission: EM | Admit: 2020-03-13 | Discharge: 2020-03-13 | Disposition: A | Discharge status: Home / Self Care | Payer: 59 | Attending: Emergency Medicine | Admitting: Emergency Medicine

## 2020-03-13 ENCOUNTER — Other Ambulatory Visit: Payer: Self-pay

## 2020-03-13 DIAGNOSIS — K5900 Constipation, unspecified: Secondary | ICD-10-CM | POA: Insufficient documentation

## 2020-03-13 DIAGNOSIS — R0982 Postnasal drip: Secondary | ICD-10-CM | POA: Insufficient documentation

## 2020-03-13 DIAGNOSIS — Z7951 Long term (current) use of inhaled steroids: Secondary | ICD-10-CM | POA: Diagnosis not present

## 2020-03-13 DIAGNOSIS — J029 Acute pharyngitis, unspecified: Secondary | ICD-10-CM | POA: Diagnosis not present

## 2020-03-13 DIAGNOSIS — R0981 Nasal congestion: Secondary | ICD-10-CM | POA: Diagnosis present

## 2020-03-13 DIAGNOSIS — Z79899 Other long term (current) drug therapy: Secondary | ICD-10-CM | POA: Insufficient documentation

## 2020-03-13 LAB — I-STAT CHEM 8, ED
BUN: 16 mg/dL (ref 6–20)
Calcium, Ion: 1.18 mmol/L (ref 1.15–1.40)
Chloride: 107 mmol/L (ref 98–111)
Creatinine, Ser: 0.7 mg/dL (ref 0.44–1.00)
Glucose, Bld: 84 mg/dL (ref 70–99)
HCT: 37 % (ref 36.0–46.0)
Hemoglobin: 12.6 g/dL (ref 12.0–15.0)
Potassium: 3.8 mmol/L (ref 3.5–5.1)
Sodium: 142 mmol/L (ref 135–145)
TCO2: 23 mmol/L (ref 22–32)

## 2020-03-13 LAB — GROUP A STREP BY PCR: Group A Strep by PCR: NOT DETECTED

## 2020-03-13 MED ORDER — SODIUM CHLORIDE 0.9 % IV BOLUS
500.0000 mL | Freq: Once | INTRAVENOUS | Status: AC
  Administered 2020-03-13: 500 mL via INTRAVENOUS

## 2020-03-13 NOTE — ED Triage Notes (Signed)
Pt c/o difficulty breathing through her nose and a sore throat. Has been seen by an ENT and states that they are unable to determine when she is unable to breathe through her nose.

## 2020-03-13 NOTE — ED Provider Notes (Signed)
[facility] EMERGENCY DEPARTMENT Provider Note   CSN: [id number] Arrival date & time: 03/13/20  0301   History Chief Complaint  Patient presents with   Nasal Congestion   Sore Throat   [name] is a 19 year old female with past medical history of chronic sinusitis and tonsillitis who presents to [facility] for evaluation of sore throat. She states that for the past nine months, she has had sore throat however yesterday her symptoms became "unbearable." She describes her sore throat as a dry, burning sensation. Her sore throat is worsened by swallowing and improved by nothing. She follows closely with ENT (last appointment seven days prior), an allergist and internal medicine for evaluation of her chronic upper respiratory symptoms.  She has had no recent sick contacts. She has been fully vaccinated against COVID-19 with two doses of Pfizer. She denies fever, chills, cough.      Past Medical History:  Diagnosis Date   Allergy    Patient Active Problem List   Diagnosis Date Noted   Nasal sinus congestion 01/12/2019   Patellofemoral pain syndrome of both knees 09/23/2017   Hypermobility syndrome 09/23/2017   Family History  Problem Relation Age of Onset   Diabetes Mother    Hypertension Father    Social History   Tobacco Use   Smoking status: Never Smoker   Smokeless tobacco: Never Used  Vaping Use   Vaping Use: Never used  Substance Use Topics   Alcohol use: No   Drug use: No   Home Medications Prior to Admission medications   Medication Sig Start Date End Date Taking? Authorizing Provider  fluticasone (FLONASE) 50 MCG/ACT nasal spray Place 1 spray into both nostrils daily. Patient taking differently: Place 1 spray into both nostrils daily as needed for allergies.  06/19/19  Yes Khatri, Hina, PA-C  guaiFENesin (ROBITUSSIN) 100 MG/5ML liquid Take 200 mg by mouth 3 (three) times daily as needed for congestion.   Yes [provider]  ibuprofen (ADVIL) 200 MG tablet Take 600 mg by mouth every 6 (six) hours as needed for headache or mild pain.   Yes [provider]  medroxyPROGESTERone (DEPO-PROVERA) 150 MG/ML injection Inject 150 mg into the muscle every 3 (three) months.   Yes [provider]  ondansetron (ZOFRAN) 4 MG tablet Take 1-2 tablets (4-8 mg total) by mouth every 6 (six) hours. As needed nausea 02/22/20  Yes Eustace Moore, MD  albuterol (VENTOLIN HFA) 108 (90 Base) MCG/ACT inhaler Inhale 2 puffs into the lungs every 6 (six) hours as needed for wheezing or shortness of breath. Dx: Dyspnea, impending bronchitis Patient not taking: Reported on 03/13/2020 07/02/19   Hoy Register, MD  Azelastine HCl 137 MCG/SPRAY SOLN Place 2 sprays into the nose daily. Each nostril, USE WITH FLONASE. Patient not taking: Reported on 03/13/2020 01/17/19   Claiborne Rigg, NP  cetirizine (ZYRTEC) 10 MG tablet Take 1 tablet (10 mg total) by mouth daily. Patient not taking: Reported on 07/02/2019 09/13/18   Anders Simmonds, PA-C  ibuprofen (ADVIL) 800 MG tablet Take 1 tablet (800 mg total) by mouth every 8 (eight) hours as needed. Patient not taking: Reported on 03/13/2020 05/31/19   Mickie Bail, NP  Vitamin D, Ergocalciferol, (DRISDOL) 1.25 MG (50000 UT) CAPS capsule Take 1 capsule (50,000 Units total) by mouth every 7 (seven) days for 90 doses. Patient not taking: Reported on 03/13/2020 11/06/18 07/22/20  Claiborne Rigg, NP   Allergies    Patient has  no known allergies.  Review of Systems   Review of Systems  Constitutional: Negative for chills and fever.  HENT: Positive for postnasal drip and sore throat. Negative for congestion, ear pain and sinus pain.   Eyes: Negative for pain and visual disturbance.  Respiratory: Negative for cough and shortness of breath.   Cardiovascular: Negative for chest pain and palpitations.  Gastrointestinal: Positive for constipation. Negative for abdominal pain,  nausea and vomiting.  Genitourinary: Negative for dysuria and hematuria.  Musculoskeletal: Negative for arthralgias and back pain.  Skin: Negative for color change and rash.  Neurological: Negative for seizures and syncope.  All other systems reviewed and are negative.  Physical Exam Updated Vital Signs BP 116/80 (BP Location: Left Arm)    Pulse 86    Temp 99.6 F (37.6 C) (Oral)    Resp 16    SpO2 100%   Physical Exam Vitals and nursing note reviewed.  Constitutional:      General: She is not in acute distress.    Appearance: She is well-developed.  HENT:     Head: Normocephalic and atraumatic.     Mouth/Throat:     Mouth: Mucous membranes are moist. No oral lesions.     Pharynx: Oropharynx is clear. Uvula midline. No pharyngeal swelling, oropharyngeal exudate, posterior oropharyngeal erythema or uvula swelling.  Eyes:     Conjunctiva/sclera: Conjunctivae normal.  Cardiovascular:     Rate and Rhythm: Normal rate and regular rhythm.     Heart sounds: Normal heart sounds. No murmur heard.   Pulmonary:     Effort: Pulmonary effort is normal. No respiratory distress.     Breath sounds: Normal breath sounds.  Abdominal:     General: Bowel sounds are normal.     Palpations: Abdomen is soft.     Tenderness: There is no abdominal tenderness.  Musculoskeletal:     Cervical back: Normal range of motion and neck supple.  Skin:    General: Skin is warm and dry.     Capillary Refill: Capillary refill takes less than 2 seconds.  Neurological:     General: No focal deficit present.     Mental Status: She is alert and oriented to person, place, and time.  Psychiatric:        Mood and Affect: Mood normal.        Behavior: Behavior normal.    ED Results / Procedures / Treatments   Labs (all labs ordered are listed, but only abnormal results are displayed) Labs Reviewed  GROUP A STREP BY PCR  I-STAT CHEM 8, ED   EKG EKG Interpretation  Date/Time:  Thursday March 13 2020  03:03:00 EDT Ventricular Rate:  93 PR Interval:  166 QRS Duration: 82 QT Interval:  324 QTC Calculation: 402 R Axis:   73 Text Interpretation: Normal sinus rhythm Normal ECG Confirmed by Lorre Nick ([id number]) on 03/13/2020 9:33:44 AM   Radiology No results found.  Procedures Procedures (including critical care time)  Medications Ordered in ED Medications  sodium chloride 0.9 % bolus 500 mL (0 mLs Intravenous Stopped 03/13/20 1016)    ED Course  I have reviewed the triage vital signs and the nursing notes.  Pertinent labs & imaging results that were available during my care of the patient were reviewed by me and considered in my medical decision making (see chart for details).    MDM Rules/Calculators/A&P  [name] is a 19 year old female with past medical history of chronic sinusitis and tonsillitis who presents to [facility] for evaluation of sore throat. She is hemodynamically stable, afebrile, and comfortable appearing on physical examination. She has no erythema, swelling, oral lesions, or exudate of her oropharynx. Group A Strep test is negative. I-Stat 8 unremarkable. She has been fully vaccinated against COVID-19. She follows closely with ENT, Allergist, and Internal Medicine for evaluation of these chronic problems. Patient's current complaints appear consistent with her baseline chronic sinusitis/pharyngitis, and there is no evidence to suspect acute viral or bacterial pharyngitis. She has received 500cc bolus of IVF in the ED. She is medically clear for discharge and has been encouraged to contact ENT for close follow-up in the outpatient setting. Additionally, patient requested for further evaluation of pain with swallowing, therefore we have placed an ambulatory referral to gastroenterology. She agrees with this plan and has no further questions or concerns.   Final Clinical Impression(s) / ED Diagnoses Final diagnoses:  Sore throat   Rx /  DC Orders ED Discharge Orders         Ordered    Ambulatory referral to Gastroenterology     Discontinue  Reprint     03/13/20 0932           Jasmine December, MD 03/13/20 1102    Lorre Nick, MD 03/14/20 1012

## 2020-03-13 NOTE — ED Provider Notes (Signed)
I saw and evaluated the patient, reviewed the resident's note and I agree with the findings and plan.  EKG: EKG Interpretation  Date/Time:  Thursday March 13 2020 03:03:00 EDT Ventricular Rate:  93 PR Interval:  166 QRS Duration: 82 QT Interval:  324 QTC Calculation: 402 R Axis:   73 Text Interpretation: Normal sinus rhythm Normal ECG Confirmed by Lorre Nick ([id number]) on 03/13/2020 9:33:36 AM    19 year old female presents with difficulty swallowing for several days.  Denies any vomiting.  Notes some dark urine and concern for dehydration.  On exam here her throat is clear.  Rapid strep negative.  No stridor noted.  Patient requesting IV fluids we will do that and check some electrolytes and hemoglobin.   Lorre Nick, MD 03/13/20 [phone]

## 2020-03-13 NOTE — Discharge Instructions (Addendum)
Please, contact ENT to continue close follow-up in the outpatient setting. We have placed an ambulatory referral to [facility] for evaluation of your swallowing complaints. If you do not hear from this office within the next week, please call to schedule your appointment. Otherwise, your Strep Test is negative, your labs and vitals look great.

## 2020-03-14 ENCOUNTER — Telehealth: Payer: Self-pay | Admitting: *Deleted

## 2020-03-14 DIAGNOSIS — R070 Pain in throat: Secondary | ICD-10-CM | POA: Diagnosis not present

## 2020-03-14 DIAGNOSIS — R1312 Dysphagia, oropharyngeal phase: Secondary | ICD-10-CM | POA: Diagnosis not present

## 2020-03-14 DIAGNOSIS — J343 Hypertrophy of nasal turbinates: Secondary | ICD-10-CM | POA: Diagnosis not present

## 2020-03-14 DIAGNOSIS — J310 Chronic rhinitis: Secondary | ICD-10-CM | POA: Diagnosis not present

## 2020-03-14 NOTE — Telephone Encounter (Signed)
Contacted patient to complete transition of care assessment:  Transition Care Management Follow-up Telephone Call  . Medicaid Managed Care Transition Call Status:MM [facility] Adalynd Donahoe Call Made  . Date of discharge and from where: [facility], 03/13/20  . How have you been since you were released from the hospital? "the same"  . Any questions or concerns? No  Items Reviewed: Marland Kitchen Did the pt receive and understand the discharge instructions provided? Yes  . Medications obtained and verified? n/a . Any new allergies since your discharge? No  . Dietary orders reviewed? N/a . Do you have support at home? Yes, family Functional Questionnaire: (I = Independent and D = Dependent)  ADLs: Independent Bathing/Dressing:Independent Meal Prep: Independent Eating: Independent Maintaining continence: Independent Transferring/Ambulation: Independent Managing Meds: Independent   Follow up appointments reviewed:  PCP Hospital f/u appt confirmed? Yes  Scheduled to see Bertram Denver on 04/02/20 @ 0900 Specialist Hospital f/u appt confirmed? Yes, Scheduled to see Dr Gearldine Bienenstock on 03/14/20@ 1530  Are transportation arrangements needed? No   If their condition worsens, is the pt aware to call PCP or go to the EmergencyDept.? Yes  Was the patient provided with contact information for the PCP's office or ED? yes  Was to pt encouraged to call back with questions or concerns? Yes  Burnard Bunting, RN, BSN, CCRN Patient Engagement Center [phone]

## 2020-03-31 ENCOUNTER — Encounter: Payer: Self-pay | Admitting: Nurse Practitioner

## 2020-04-02 ENCOUNTER — Other Ambulatory Visit: Payer: Self-pay

## 2020-04-02 ENCOUNTER — Ambulatory Visit: Payer: 59 | Attending: Family Medicine

## 2020-04-02 VITALS — Temp 97.9°F

## 2020-04-02 DIAGNOSIS — Z309 Encounter for contraceptive management, unspecified: Secondary | ICD-10-CM | POA: Diagnosis not present

## 2020-04-02 MED ORDER — MEDROXYPROGESTERONE ACETATE 150 MG/ML IM SUSP
150.0000 mg | Freq: Once | INTRAMUSCULAR | Status: AC
  Administered 2020-04-02: 150 mg via INTRAMUSCULAR

## 2020-04-02 NOTE — Progress Notes (Signed)
DOB and Name verified Pt is here for her DEPO injections/ Last Depo-Provera: 01/16/2020 within time interval  Side Effects if any: none verbalized Serum HCG indicated?NA Depo-Provera 150 mg IM given in R Gluteus medius  muscle . Injection well tolerated. No reaction noted at the injection site. Explained pt the importance adherence to depo provera perpetual calendar. Education given to pt in regards to use extra protection and possible false negative HGC if antibiotic intake or missed intervals.Verbalized understanding Next shot scheduled on 11/17//2021

## 2020-04-08 ENCOUNTER — Telehealth: Payer: 59 | Admitting: Family

## 2020-04-11 DIAGNOSIS — J3089 Other allergic rhinitis: Secondary | ICD-10-CM | POA: Diagnosis not present

## 2020-04-11 DIAGNOSIS — J3081 Allergic rhinitis due to animal (cat) (dog) hair and dander: Secondary | ICD-10-CM | POA: Diagnosis not present

## 2020-04-11 DIAGNOSIS — R05 Cough: Secondary | ICD-10-CM | POA: Diagnosis not present

## 2020-04-11 DIAGNOSIS — J301 Allergic rhinitis due to pollen: Secondary | ICD-10-CM | POA: Diagnosis not present

## 2020-04-16 ENCOUNTER — Ambulatory Visit: Payer: 59 | Attending: Family | Admitting: Family Medicine

## 2020-04-16 ENCOUNTER — Encounter: Payer: Self-pay | Admitting: Family Medicine

## 2020-04-16 VITALS — Ht 63.0 in | Wt 114.0 lb

## 2020-04-16 DIAGNOSIS — R1310 Dysphagia, unspecified: Secondary | ICD-10-CM

## 2020-04-16 DIAGNOSIS — Z09 Encounter for follow-up examination after completed treatment for conditions other than malignant neoplasm: Secondary | ICD-10-CM

## 2020-04-16 DIAGNOSIS — J029 Acute pharyngitis, unspecified: Secondary | ICD-10-CM

## 2020-04-16 MED ORDER — OMEPRAZOLE 20 MG PO CPDR: 20.0000 mg | DELAYED_RELEASE_CAPSULE | Freq: Two times a day (BID) | ORAL | 1 refills | Status: DC

## 2020-04-16 NOTE — Progress Notes (Signed)
Virtual Visit via Telephone Note  I connected with Mary Glover on 04/16/20 at  3:30 PM EDT by telephone and verified that I am speaking with the correct person using two identifiers.   I discussed the limitations, risks, security and privacy concerns of performing an evaluation and management service by telephone and the availability of in person appointments. I also discussed with the patient that there may be a patient responsible charge related to this service. The patient expressed understanding and agreed to proceed.  Patient Location: Work Dispensing optician: [facility] Others participating in call: call initiated by Eloise Levels, RN who then transferred the call to me   History of Present Illness:        19 year old female who is seen in follow-up of due to the complaint of sore throat and difficulty swallowing for several days.  She reports that she continues to wake up each morning with a dry throat with a slight burning sensation.  She has had some acid reflux symptoms as well as continued nausea.  She was prescribed nausea medication during her emergency department visit and reports that she has an upcoming appointment with GI.  She is not currently taking any medication for acid reflux.  She denies current abdominal pain.  She continues to feel as if she has to swallow more than once in order to get solid foods down.  She does not have any difficulty swallowing liquids.  Prior to her ED visit, she was having nausea with solids as well as liquids.  She has been previously evaluated by ENT.  She does have a history of allergic rhinitis with postnasal drainage.  She has had past issues with constipation.  No current headache or dizziness and no fever or chills.         Past Medical History:  Diagnosis Date   Allergy     No past surgical history on file.  Family History  Problem Relation Age of Onset   Diabetes Mother    Hypertension Father     Social History   Tobacco  Use   Smoking status: Never Smoker   Smokeless tobacco: Never Used  Vaping Use   Vaping Use: Never used  Substance Use Topics   Alcohol use: Not Currently   Drug use: Yes    Types: Marijuana    Comment: recreational/every once and awhile      No Known Allergies     Observations/Objective: No vital signs or physical exam conducted as visit was done via telephone  Assessment and Plan: 1. Encounter for examination following treatment at hospital 2. Sore throat 3. Dysphagia, unspecified type She reports waking up daily with a sore throat and feeling as if she has to swallow more than once to get foods down as well as recurrent nausea. She does have an upcoming GI appointment in early October. Rx is being sent in to her pharmacy for omeprazole 20 mg twice per day and she is also advised to avoid spicy/greasy foods as well as avoidance of late night eating.  - omeprazole (PRILOSEC) 20 MG capsule; Take 1 capsule (20 mg total) by mouth 2 (two) times daily before a meal. To reduce stomach acid  Dispense: 30 capsule; Refill: 1  Follow Up Instructions:Return in about 6 weeks (around 05/28/2020) for chronic issues with PCP.    I discussed the assessment and treatment plan with the patient. The patient was provided an opportunity to ask questions and all were answered. The patient agreed with  the plan and demonstrated an understanding of the instructions.   The patient was advised to call back or seek an in-person evaluation if the symptoms worsen or if the condition fails to improve as anticipated.  I provided 8 minutes of non-face-to-face time during this encounter.   Cain Saupe, MD

## 2020-05-05 ENCOUNTER — Encounter: Payer: Self-pay | Admitting: Nurse Practitioner

## 2020-05-05 ENCOUNTER — Other Ambulatory Visit (INDEPENDENT_AMBULATORY_CARE_PROVIDER_SITE_OTHER): Payer: 59

## 2020-05-05 ENCOUNTER — Ambulatory Visit (INDEPENDENT_AMBULATORY_CARE_PROVIDER_SITE_OTHER): Payer: 59 | Admitting: Nurse Practitioner

## 2020-05-05 VITALS — BP 102/70 | HR 82 | Ht 63.0 in | Wt 112.0 lb

## 2020-05-05 DIAGNOSIS — K219 Gastro-esophageal reflux disease without esophagitis: Secondary | ICD-10-CM | POA: Diagnosis not present

## 2020-05-05 DIAGNOSIS — K5900 Constipation, unspecified: Secondary | ICD-10-CM | POA: Diagnosis not present

## 2020-05-05 DIAGNOSIS — R1310 Dysphagia, unspecified: Secondary | ICD-10-CM | POA: Diagnosis not present

## 2020-05-05 DIAGNOSIS — R1312 Dysphagia, oropharyngeal phase: Secondary | ICD-10-CM | POA: Diagnosis not present

## 2020-05-05 LAB — CBC WITH DIFFERENTIAL/PLATELET
Basophils Absolute: 0 10*3/uL (ref 0.0–0.1)
Basophils Relative: 1 % (ref 0.0–3.0)
Eosinophils Absolute: 0 10*3/uL (ref 0.0–0.7)
Eosinophils Relative: 0.6 % (ref 0.0–5.0)
HCT: 38.1 % (ref 36.0–46.0)
Hemoglobin: 12.9 g/dL (ref 12.0–15.0)
Lymphocytes Relative: 60.1 % — ABNORMAL HIGH (ref 12.0–46.0)
Lymphs Abs: 2.3 10*3/uL (ref 0.7–4.0)
MCHC: 33.7 g/dL (ref 30.0–36.0)
MCV: 88.5 fl (ref 78.0–100.0)
Monocytes Absolute: 0.3 10*3/uL (ref 0.1–1.0)
Monocytes Relative: 9.1 % (ref 3.0–12.0)
Neutro Abs: 1.1 10*3/uL — ABNORMAL LOW (ref 1.4–7.7)
Neutrophils Relative %: 29.2 % — ABNORMAL LOW (ref 43.0–77.0)
Platelets: 158 10*3/uL (ref 150.0–400.0)
RBC: 4.31 Mil/uL (ref 3.87–5.11)
RDW: 13.9 % (ref 11.5–15.5)
WBC: 3.8 10*3/uL — ABNORMAL LOW (ref 4.0–10.5)

## 2020-05-05 LAB — COMPREHENSIVE METABOLIC PANEL
ALT: 9 U/L (ref 0–35)
AST: 12 U/L (ref 0–37)
Albumin: 5 g/dL (ref 3.5–5.2)
Alkaline Phosphatase: 36 U/L — ABNORMAL LOW (ref 39–117)
BUN: 13 mg/dL (ref 6–23)
CO2: 26 mEq/L (ref 19–32)
Calcium: 9.9 mg/dL (ref 8.4–10.5)
Chloride: 105 mEq/L (ref 96–112)
Creatinine, Ser: 0.84 mg/dL (ref 0.40–1.20)
GFR: 102.86 mL/min (ref >60.00)
Glucose, Bld: 88 mg/dL (ref 70–99)
Potassium: 4.5 mEq/L (ref 3.5–5.1)
Sodium: 139 mEq/L (ref 135–145)
Total Bilirubin: 0.5 mg/dL (ref 0.2–1.2)
Total Protein: 8 g/dL (ref 6.0–8.3)

## 2020-05-05 LAB — LIPASE: Lipase: 34 U/L (ref 11.0–59.0)

## 2020-05-05 LAB — TSH: TSH: 0.96 u[IU]/mL (ref 0.35–4.50)

## 2020-05-05 MED ORDER — OMEPRAZOLE 20 MG PO CPDR
20.0000 mg | DELAYED_RELEASE_CAPSULE | Freq: Two times a day (BID) | ORAL | 1 refills | Status: DC
Start: 2020-05-05 — End: 2020-08-06

## 2020-05-05 NOTE — Patient Instructions (Addendum)
If you are age 21 or older, your body mass index should be between 23-30. Your Body mass index is 19.84 kg/m. If this is out of the aforementioned range listed, please consider follow up with your Primary Care Provider.  If you are age 46 or younger, your body mass index should be between 19-25. Your Body mass index is 19.84 kg/m. If this is out of the aformentioned range listed, please consider follow up with your Primary Care Provider.   Your provider has requested that you go to the basement level for lab work before leaving today. Press "B" on the elevator. The lab is located at the first door on the left as you exit the elevator.  We have sent the following medications to your pharmacy for you to pick up at your convenience:  Pick up omeprazole 20mg  take 1 tablet 2 times a day.  1. Use over the counter benefiber in the AM     Use over the counter miralax at bedtime 2. We have given you pamphlets on GERD to read and follow a GERD diet. 3 Follow up with DR Nandigam 07/09/2020 @ 9:50am 4. Call the office if your symptoms worsen.  Due to recent changes in healthcare laws, you may see the results of your imaging and laboratory studies on MyChart before your provider has had a chance to review them.  We understand that in some cases there may be results that are confusing or concerning to you. Not all laboratory results come back in the same time frame and the provider may be waiting for multiple results in order to interpret others.  Please give 14/03/2020 48 hours in order for your provider to thoroughly review all the results before contacting the office for clarification of your results.   Thank you for choosing [facility] [place], CRNP  [phone]

## 2020-05-05 NOTE — Progress Notes (Signed)
05/05/2020 Mary Glover [id number] 10/10/2000   CHIEF COMPLAINT: dysphagia, constipation    HISTORY OF PRESENT ILLNESS:  Mary Glover is a 19 year old female with a past medica history of chronic sinusitis and tonsillitis. Past wisdom teeth extraction. She presents to our office today as referred by Dr. Lorre Nick for further evaluation regarding dysphagia for the past year. She reports having throat pain which is sometime worse when swallowing saliva, water and food. She describes feeling a "pulling sensation" in her throat when swallowing. Food does not get stuck in the throat but possibly passes down the esophagus a bit slower at times. "It takes extra effort to swallow". She has noticed increased belching for the past month as well. She has intermittent nausea without vomiting. She has heartburn a few days weekly. She has a history of sinusitis. She is followed by ENT Dr. Christain [place]. She reported completing a laryngoscopy by Dr. Gearldine Bienenstock approximately one month ago which was normal. She was referred to see an allergist. She is taking Flonase nasal spray 2 sprays into each nostril QOD for the past year. She was last prescribed an antibiotic for sinusitis 6+ months ago. No history of oral thrush. She has an irritable stomach at times. If she drinks too much water or coffee she develops nausea. She sometimes has nausea prior to urinating. She has constipation described as passing a normal brown BM 3 days weekly, feels emptied on these days. Other days of the week, she passes small hard stools. No rectal bleeding or black stools. Paternal grandfather with history of colon cancer. She reports eating 3 meals daily. No weight loss. Occasional alcohol use, no excessive alcohol intake. She takes Ibuprofen 200mg  2 to 3 tabs once monthly for headaches. She was previously prescribed Omeprazole 20mg  po bid but she stated she has not picked up this prescription.   She presented to [facility] ED on 03/13/2020 with  complains of a sore throat and odynophagia. She was concerned she had recurrence of past tonsillitis. She described feeling as if her throat and nose were tight and she had difficulty breathing but she could speak. A rapid strep test was negative. She was afebrile. She was advised to schedule a GI consult.   CBC Latest Ref Rng & Units 03/13/2020 06/19/2019 05/31/2019  WBC 4.0 - 10.5 K/uL - 5.1 9.4  Hemoglobin 12.0 - 15.0 g/dL 06/21/2019 11.8(L) 13.2  Hematocrit 36 - 46 % 37.0 36.9 39.5  Platelets 150 - 400 K/uL - 151 158    CMP Latest Ref Rng & Units 03/13/2020 06/19/2019 05/31/2019  Glucose 70 - 99 mg/dL 84 06/21/2019) 90  BUN 6 - 20 mg/dL 16 9 8   Creatinine 0.44 - 1.00 mg/dL 06/02/2019 329(V  Sodium 135 - 145 mmol/L 142 137 136  Potassium 3.5 - 5.1 mmol/L 3.8 3.8 3.6  Chloride 98 - 111 mmol/L 107 103 103  CO2 22 - 32 mmol/L - 25 20(L)  Calcium 8.9 - 10.3 mg/dL - 9.3 9.7  Total Protein 6.0 - 8.3 g/dL - - -  Total Bilirubin 0.3 - 1.2 mg/dL - - -  Alkaline Phos 50 - 162 U/L - - -  AST 0 - 37 U/L - - -  ALT 0 - 35 U/L - - -    Past Medical History:  Diagnosis Date  . Allergy    Social History:  She is single. She is a 9.16. She smokes marijuana a few days weekly. Limited alcohol use.  Family History: Mother with history of DM and GERD. Father with history of HTN. Paternal grandfather with history of colon cancer.   No Known Allergies    Outpatient Encounter Medications as of 05/05/2020  Medication Sig  . albuterol (VENTOLIN HFA) 108 (90 Base) MCG/ACT inhaler Inhale 2 puffs into the lungs every 6 (six) hours as needed for wheezing or shortness of breath. Dx: Dyspnea, impending bronchitis (Patient not taking: Reported on 03/13/2020)  . Azelastine HCl 137 MCG/SPRAY SOLN Place 2 sprays into the nose daily. Each nostril, USE WITH FLONASE. (Patient not taking: Reported on 03/13/2020)  . cetirizine (ZYRTEC) 10 MG tablet Take 1 tablet (10 mg total) by mouth daily. (Patient not taking:  Reported on 07/02/2019)  . fluticasone (FLONASE) 50 MCG/ACT nasal spray Place 1 spray into both nostrils daily. (Patient not taking: Reported on 04/16/2020)  . guaiFENesin (ROBITUSSIN) 100 MG/5ML liquid Take 200 mg by mouth 3 (three) times daily as needed for congestion. (Patient not taking: Reported on 04/16/2020)  . ibuprofen (ADVIL) 200 MG tablet Take 600 mg by mouth every 6 (six) hours as needed for headache or mild pain.  Marland Kitchen ibuprofen (ADVIL) 800 MG tablet Take 1 tablet (800 mg total) by mouth every 8 (eight) hours as needed. (Patient not taking: Reported on 03/13/2020)  . medroxyPROGESTERone (DEPO-PROVERA) 150 MG/ML injection Inject 150 mg into the muscle every 3 (three) months.  Marland Kitchen omeprazole (PRILOSEC) 20 MG capsule Take 1 capsule (20 mg total) by mouth 2 (two) times daily before a meal. To reduce stomach acid  . ondansetron (ZOFRAN) 4 MG tablet Take 1-2 tablets (4-8 mg total) by mouth every 6 (six) hours. As needed nausea (Patient not taking: Reported on 04/16/2020)  . Vitamin D, Ergocalciferol, (DRISDOL) 1.25 MG (50000 UT) CAPS capsule Take 1 capsule (50,000 Units total) by mouth every 7 (seven) days for 90 doses. (Patient not taking: Reported on 03/13/2020)   No facility-administered encounter medications on file as of 05/05/2020.     REVIEW OF SYSTEMS:  Gen: Denies fever, sweats or chills. No weight loss.  CV: Denies chest pain, palpitations or edema. Resp: See HPI. No cough.  GI: See HPI.  GU : + increased urinary frequency. No dysuria.  MS: + muscle cramps.  Derm: Denies rash, itchiness, skin lesions or unhealing ulcers. Psych: Denies depression or  anxiety.  Heme: Denies bruising, bleeding. Neuro:  Occasional headache. No dizziness or paresthesias. Endo:  + excessive thirst.     PHYSICAL EXAM: BP 102/70   Pulse 82   Ht 5\' 3"  (1.6 m)   Wt 112 lb (50.8 kg)   BMI 19.84 kg/m   General: Petite  19 year old female in no acute distress. Head: Normocephalic and  atraumatic. Eyes:  Sclerae non-icteric, conjunctive pink. Ears: Normal auditory acuity. Mouth: Dentition intact. No ulcers or lesions. No oral thrush.  Neck: Supple, no lymphadenopathy or thyromegaly.  Lungs: Clear bilaterally to auscultation without wheezes, crackles or rhonchi. Heart: Regular rate and rhythm. No murmur, rub or gallop appreciated.  Abdomen: Soft, nontender, non distended. No masses. No hepatosplenomegaly. Normoactive bowel sounds x 4 quadrants.  Rectal: Deferred.  Musculoskeletal: Symmetrical with no gross deformities. Skin: Warm and dry. No rash or lesions on visible extremities. Extremities: No edema. Neurological: Alert oriented x 4, no focal deficits.  Psychological:  Alert and cooperative. Normal mood and affect.  ASSESSMENT AND PLAN:  68. 19 year old female with GERD symptoms, pharyngeal odynophagia and pharyngeal dysphagia  -CBC, CMP, CRP, TTG, IGA and lipase  -  Start Omeprazole 20mg  po bid -GERD handout  -Discussed scheduling an EGD to rule out reflux vs eosinophilic esophagitis vs candidiasis esophagitis.  -If no improvement, to schedule EGD at the time of her follow up appointment with Dr. in 4 to 6 weeks -She will call me in 2 weeks, if her symptoms worsen an EGD will be schedule prior to her follow up appointment  2. Nausea, resolving  -See plan in # 1 -Abdominal sonogram if nausea worsens   3. Constipation -Benefiber 1 tablespoon in the am -Miralax Q HS    CC:  Lavon Paganini, MD

## 2020-05-06 LAB — TISSUE TRANSGLUTAMINASE, IGA: (tTG) Ab, IgA: <1 U/mL

## 2020-05-09 ENCOUNTER — Other Ambulatory Visit: Payer: Self-pay

## 2020-05-09 ENCOUNTER — Telehealth: Payer: Self-pay | Admitting: Nurse Practitioner

## 2020-05-09 DIAGNOSIS — D709 Neutropenia, unspecified: Secondary | ICD-10-CM

## 2020-05-09 NOTE — Telephone Encounter (Signed)
Patient called states she is having trouble swallowing her medication Omeprazole

## 2020-05-09 NOTE — Telephone Encounter (Signed)
Spoke with this very nice patient. Her Omeprazole is capsule form. She is able to swallow pudding, yogurt, apple sauce and ice cream without difficulty. She is instructed to open the capsule into a spoonful of one of these foods. Take the spoonful, swallow and follow with a liquid such as water. Confirmed with the patient that we will do the referral to Hematology. [facility] and [facility] will contact her directly for the appointment. (see lab report CBC).

## 2020-05-12 ENCOUNTER — Telehealth: Payer: Self-pay | Admitting: Hematology and Oncology

## 2020-05-12 NOTE — Progress Notes (Signed)
Reviewed and agree with documentation and assessment and plan. K. Veena Pablo Stauffer , MD   

## 2020-05-12 NOTE — Telephone Encounter (Signed)
Scheduled appointment per referral from [facility]. Spoke to patient who is aware of appointment date and time.

## 2020-05-30 ENCOUNTER — Telehealth: Payer: Self-pay | Admitting: Hematology and Oncology

## 2020-05-30 NOTE — Telephone Encounter (Signed)
I cld Mary Glover to reschedule her new hem appt w/Dr. Leonides Schanz. I was unable to reach her and asked that she call back to reschedule. I rescheduled her appt to 11/22 at 1pm.

## 2020-06-02 ENCOUNTER — Inpatient Hospital Stay: Payer: 59

## 2020-06-02 ENCOUNTER — Inpatient Hospital Stay: Payer: 59 | Admitting: Hematology and Oncology

## 2020-06-18 ENCOUNTER — Ambulatory Visit: Payer: 59 | Attending: Nurse Practitioner

## 2020-06-18 ENCOUNTER — Other Ambulatory Visit: Payer: Self-pay

## 2020-06-18 ENCOUNTER — Ambulatory Visit: Payer: 59

## 2020-06-18 DIAGNOSIS — Z309 Encounter for contraceptive management, unspecified: Secondary | ICD-10-CM | POA: Diagnosis not present

## 2020-06-18 MED ORDER — MEDROXYPROGESTERONE ACETATE 150 MG/ML IM SUSP
150.0000 mg | Freq: Once | INTRAMUSCULAR | Status: AC
  Administered 2020-06-18: 150 mg via INTRAMUSCULAR

## 2020-06-18 NOTE — Progress Notes (Signed)
DEPO ADMINSTERED

## 2020-06-23 ENCOUNTER — Inpatient Hospital Stay: Payer: 59 | Attending: Hematology and Oncology | Admitting: Hematology and Oncology

## 2020-06-23 ENCOUNTER — Inpatient Hospital Stay: Payer: 59

## 2020-07-07 ENCOUNTER — Ambulatory Visit: Payer: Self-pay

## 2020-07-07 ENCOUNTER — Emergency Department (HOSPITAL_COMMUNITY)
Admission: EM | Admit: 2020-07-07 | Discharge: 2020-07-08 | Disposition: A | Discharge status: Home / Self Care | Payer: 59 | Attending: Emergency Medicine | Admitting: Emergency Medicine

## 2020-07-07 ENCOUNTER — Encounter (HOSPITAL_COMMUNITY): Payer: Self-pay | Admitting: Emergency Medicine

## 2020-07-07 ENCOUNTER — Other Ambulatory Visit: Payer: Self-pay

## 2020-07-07 DIAGNOSIS — R0981 Nasal congestion: Secondary | ICD-10-CM | POA: Insufficient documentation

## 2020-07-07 DIAGNOSIS — K219 Gastro-esophageal reflux disease without esophagitis: Secondary | ICD-10-CM | POA: Insufficient documentation

## 2020-07-07 DIAGNOSIS — R1084 Generalized abdominal pain: Secondary | ICD-10-CM | POA: Insufficient documentation

## 2020-07-07 DIAGNOSIS — Z20822 Contact with and (suspected) exposure to covid-19: Secondary | ICD-10-CM | POA: Diagnosis not present

## 2020-07-07 DIAGNOSIS — R110 Nausea: Secondary | ICD-10-CM | POA: Insufficient documentation

## 2020-07-07 DIAGNOSIS — M79603 Pain in arm, unspecified: Secondary | ICD-10-CM | POA: Insufficient documentation

## 2020-07-07 DIAGNOSIS — R350 Frequency of micturition: Secondary | ICD-10-CM | POA: Insufficient documentation

## 2020-07-07 DIAGNOSIS — G8929 Other chronic pain: Secondary | ICD-10-CM | POA: Insufficient documentation

## 2020-07-07 DIAGNOSIS — R519 Headache, unspecified: Secondary | ICD-10-CM | POA: Diagnosis not present

## 2020-07-07 DIAGNOSIS — R202 Paresthesia of skin: Secondary | ICD-10-CM | POA: Insufficient documentation

## 2020-07-07 DIAGNOSIS — M79606 Pain in leg, unspecified: Secondary | ICD-10-CM | POA: Insufficient documentation

## 2020-07-07 DIAGNOSIS — R5383 Other fatigue: Secondary | ICD-10-CM | POA: Diagnosis not present

## 2020-07-07 DIAGNOSIS — Z5321 Procedure and treatment not carried out due to patient leaving prior to being seen by health care provider: Secondary | ICD-10-CM | POA: Insufficient documentation

## 2020-07-07 LAB — COMPREHENSIVE METABOLIC PANEL
ALT: 11 U/L (ref 0–44)
AST: 14 U/L — ABNORMAL LOW (ref 15–41)
Albumin: 4.3 g/dL (ref 3.5–5.0)
Alkaline Phosphatase: 35 U/L — ABNORMAL LOW (ref 38–126)
Anion gap: 8 (ref 5–15)
BUN: 14 mg/dL (ref 6–20)
CO2: 23 mmol/L (ref 22–32)
Calcium: 9.3 mg/dL (ref 8.9–10.3)
Chloride: 106 mmol/L (ref 98–111)
Creatinine, Ser: 0.98 mg/dL (ref 0.44–1.00)
GFR, Estimated: >60 mL/min (ref >60)
Glucose, Bld: 93 mg/dL (ref 70–99)
Potassium: 3.8 mmol/L (ref 3.5–5.1)
Sodium: 137 mmol/L (ref 135–145)
Total Bilirubin: 0.9 mg/dL (ref 0.3–1.2)
Total Protein: 7.4 g/dL (ref 6.5–8.1)

## 2020-07-07 LAB — CBC
HCT: 37.8 % (ref 36.0–46.0)
Hemoglobin: 12.9 g/dL (ref 12.0–15.0)
MCH: 30 pg (ref 26.0–34.0)
MCHC: 34.1 g/dL (ref 30.0–36.0)
MCV: 87.9 fL (ref 80.0–100.0)
Platelets: 186 10*3/uL (ref 150–400)
RBC: 4.3 MIL/uL (ref 3.87–5.11)
RDW: 13.3 % (ref 11.5–15.5)
WBC: 5.3 10*3/uL (ref 4.0–10.5)
nRBC: 0 % (ref 0.0–0.2)

## 2020-07-07 LAB — URINALYSIS, ROUTINE W REFLEX MICROSCOPIC
Bilirubin Urine: NEGATIVE
Glucose, UA: NEGATIVE mg/dL
Hgb urine dipstick: NEGATIVE
Ketones, ur: 5 mg/dL — AB
Leukocytes,Ua: NEGATIVE
Nitrite: NEGATIVE
Protein, ur: NEGATIVE mg/dL
Specific Gravity, Urine: 1.029 (ref 1.005–1.030)
pH: 6 (ref 5.0–8.0)

## 2020-07-07 LAB — LIPASE, BLOOD: Lipase: 34 U/L (ref 11–51)

## 2020-07-07 LAB — I-STAT BETA HCG BLOOD, ED (MC, WL, AP ONLY): I-stat hCG, quantitative: <5 m[IU]/mL (ref <5)

## 2020-07-07 NOTE — ED Triage Notes (Signed)
Pt reports generalized abdominal pain for the last 3 days with nausea no emesis or diarrhea.   She also has a a headache since Friday, she woke up this morning feeling like her left leg and arm. Pt reports chronic leg pain.

## 2020-07-07 NOTE — ED Notes (Signed)
Pt stepping outside, but will return

## 2020-07-07 NOTE — Telephone Encounter (Signed)
Pt. Reports she woke up with headache and complete numbness to left arm and leg."I have had tingling before, but never be numb." Reports numbness is better now. Has shortness of breath as well. Instructed to have some one take her to ED. Verbalizes understanding.  Reason for Disposition . Headache  (and neurologic deficit)  Answer Assessment - Initial Assessment Questions 1. SYMPTOM: "What is the main symptom you are concerned about?" (e.g., weakness, numbness)     Left arm and leg numb 2. ONSET: "When did this start?" (minutes, hours, days; while sleeping)     This morning 3. LAST NORMAL: "When was the last time you were normal (no symptoms)?"     Few days ago 4. PATTERN "Does this come and go, or has it been constant since it started?"  "Is it present now?"     Comes and goes 5. CARDIAC SYMPTOMS: "Have you had any of the following symptoms: chest pain, difficulty breathing, palpitations?"     Shortness of breath 6. NEUROLOGIC SYMPTOMS: "Have you had any of the following symptoms: headache, dizziness, vision loss, double vision, changes in speech, unsteady on your feet?"     Headache, nausea 7. OTHER SYMPTOMS: "Do you have any other symptoms?"     No 8. PREGNANCY: "Is there any chance you are pregnant?" "When was your last menstrual period?"     No  Protocols used: NEUROLOGIC DEFICIT-A-AH

## 2020-07-07 NOTE — ED Notes (Signed)
Pt report having left leg numb

## 2020-07-08 ENCOUNTER — Emergency Department (HOSPITAL_BASED_OUTPATIENT_CLINIC_OR_DEPARTMENT_OTHER)
Admission: EM | Admit: 2020-07-08 | Discharge: 2020-07-08 | Disposition: A | Discharge status: Home / Self Care | Payer: 59 | Source: Home / Self Care | Attending: Emergency Medicine | Admitting: Emergency Medicine

## 2020-07-08 ENCOUNTER — Other Ambulatory Visit: Payer: Self-pay

## 2020-07-08 ENCOUNTER — Encounter (HOSPITAL_BASED_OUTPATIENT_CLINIC_OR_DEPARTMENT_OTHER): Payer: Self-pay

## 2020-07-08 DIAGNOSIS — R109 Unspecified abdominal pain: Secondary | ICD-10-CM | POA: Insufficient documentation

## 2020-07-08 DIAGNOSIS — R350 Frequency of micturition: Secondary | ICD-10-CM | POA: Insufficient documentation

## 2020-07-08 DIAGNOSIS — K219 Gastro-esophageal reflux disease without esophagitis: Secondary | ICD-10-CM | POA: Insufficient documentation

## 2020-07-08 DIAGNOSIS — R202 Paresthesia of skin: Secondary | ICD-10-CM | POA: Insufficient documentation

## 2020-07-08 DIAGNOSIS — Z20822 Contact with and (suspected) exposure to covid-19: Secondary | ICD-10-CM | POA: Insufficient documentation

## 2020-07-08 DIAGNOSIS — R110 Nausea: Secondary | ICD-10-CM | POA: Insufficient documentation

## 2020-07-08 DIAGNOSIS — R5383 Other fatigue: Secondary | ICD-10-CM | POA: Insufficient documentation

## 2020-07-08 DIAGNOSIS — R0981 Nasal congestion: Secondary | ICD-10-CM | POA: Insufficient documentation

## 2020-07-08 DIAGNOSIS — R0602 Shortness of breath: Secondary | ICD-10-CM | POA: Insufficient documentation

## 2020-07-08 DIAGNOSIS — R519 Headache, unspecified: Secondary | ICD-10-CM | POA: Insufficient documentation

## 2020-07-08 DIAGNOSIS — R1084 Generalized abdominal pain: Secondary | ICD-10-CM | POA: Diagnosis not present

## 2020-07-08 LAB — CBC WITH DIFFERENTIAL/PLATELET
Abs Immature Granulocytes: 0.01 10*3/uL (ref 0.00–0.07)
Basophils Absolute: 0 10*3/uL (ref 0.0–0.1)
Basophils Relative: 1 %
Eosinophils Absolute: 0 10*3/uL (ref 0.0–0.5)
Eosinophils Relative: 1 %
HCT: 39.3 % (ref 36.0–46.0)
Hemoglobin: 13.1 g/dL (ref 12.0–15.0)
Immature Granulocytes: 0 %
Lymphocytes Relative: 51 %
Lymphs Abs: 2.5 10*3/uL (ref 0.7–4.0)
MCH: 29.4 pg (ref 26.0–34.0)
MCHC: 33.3 g/dL (ref 30.0–36.0)
MCV: 88.3 fL (ref 80.0–100.0)
Monocytes Absolute: 0.5 10*3/uL (ref 0.1–1.0)
Monocytes Relative: 10 %
Neutro Abs: 1.8 10*3/uL (ref 1.7–7.7)
Neutrophils Relative %: 37 %
Platelets: 156 10*3/uL (ref 150–400)
RBC: 4.45 MIL/uL (ref 3.87–5.11)
RDW: 13.2 % (ref 11.5–15.5)
WBC: 4.9 10*3/uL (ref 4.0–10.5)
nRBC: 0 % (ref 0.0–0.2)

## 2020-07-08 LAB — URINALYSIS, ROUTINE W REFLEX MICROSCOPIC
Bilirubin Urine: NEGATIVE
Glucose, UA: NEGATIVE mg/dL
Hgb urine dipstick: NEGATIVE
Ketones, ur: NEGATIVE mg/dL
Leukocytes,Ua: NEGATIVE
Nitrite: NEGATIVE
Protein, ur: NEGATIVE mg/dL
Specific Gravity, Urine: >1.03 — ABNORMAL HIGH (ref 1.005–1.030)
pH: 5.5 (ref 5.0–8.0)

## 2020-07-08 LAB — RESP PANEL BY RT-PCR (FLU A&B, COVID) ARPGX2
Influenza A by PCR: NEGATIVE
Influenza B by PCR: NEGATIVE
SARS Coronavirus 2 by RT PCR: NEGATIVE

## 2020-07-08 NOTE — ED Triage Notes (Signed)
I have had ABD for 4 days, I have had HA since yesterday.

## 2020-07-08 NOTE — Telephone Encounter (Signed)
Noted. Agreed w/ the advice for patient to go to E/D.

## 2020-07-08 NOTE — ED Provider Notes (Signed)
[facility] EMERGENCY DEPARTMENT Provider Note   CSN: [id number] Arrival date & time: 07/08/20  [id number]     History Chief Complaint  Patient presents with  . Abdominal Pain  . Headache    Mary Glover is a 19 y.o. female w/ PMHx of GERD, dysphagia, and constipation presenting due to 1 day of headache and left arm and leg numbness.   She is endorsing abdominal pain, leg and arm numbness and headaches that occured yesterday. Has tingling in her limbs intermittently and is chronic at baseline but had to pick up arm and move it yesterday. No recent illness. Increased urination and fatigue. Congestion and nausea. Using zofran for nausea. Follow up appt with GI on 12/10. Shortness of breath that is chronic x months. No dietary abnormalities or unexpected weight changes. Ran out of of omeprazole. Most worried about the headache and numbness as these symptoms scared her. Endorses blurriness but resolved with closing eyes. Other specialist she sees are ENT & allergy. Occasional marijuana use, last reported was several weeks ago. LMP 2 years ago, on depo.    Past Medical History:  Diagnosis Date  . Allergy    Patient Active Problem List   Diagnosis Date Noted  . Dysphagia 05/05/2020  . Nasal sinus congestion 01/12/2019  . Patellofemoral pain syndrome of both knees 09/23/2017  . Hypermobility syndrome 09/23/2017   Past Surgical History:  Procedure Laterality Date  . WISDOM TOOTH EXTRACTION      OB History   No obstetric history on file.    Family History  Problem Relation Age of Onset  . Diabetes Mother   . Hypertension Father   . Pancreatic cancer Maternal Grandfather   . Colon cancer Paternal Grandfather   . Stomach cancer Neg Hx   . Esophageal cancer Neg Hx     Social History   Tobacco Use  . Smoking status: Never Smoker  . Smokeless tobacco: Never Used  Vaping Use  . Vaping Use: Never used  Substance Use Topics  . Alcohol use: Yes    Comment: rare   .  Drug use: Yes    Types: Marijuana    Comment: recreational/every once and awhile     Home Medications Prior to Admission medications   Medication Sig Start Date End Date Taking? Authorizing Provider  albuterol (VENTOLIN HFA) 108 (90 Base) MCG/ACT inhaler Inhale 2 puffs into the lungs every 6 (six) hours as needed for wheezing or shortness of breath. Dx: Dyspnea, impending bronchitis 07/02/19   Hoy Register, MD  Azelastine HCl 137 MCG/SPRAY SOLN Place 2 sprays into the nose daily. Each nostril, USE WITH FLONASE. 01/17/19   Claiborne Rigg, NP  cetirizine (ZYRTEC) 10 MG tablet Take 1 tablet (10 mg total) by mouth daily. 09/13/18   Anders Simmonds, PA-C  fluticasone (FLONASE) 50 MCG/ACT nasal spray Place 1 spray into both nostrils daily. 06/19/19   Khatri, Hina, PA-C  guaiFENesin (ROBITUSSIN) 100 MG/5ML liquid Take 200 mg by mouth 3 (three) times daily as needed for congestion.     [provider]  ibuprofen (ADVIL) 200 MG tablet Take 600 mg by mouth every 6 (six) hours as needed for headache or mild pain.    [provider]  ibuprofen (ADVIL) 800 MG tablet Take 1 tablet (800 mg total) by mouth every 8 (eight) hours as needed. 05/31/19   Mickie Bail, NP  medroxyPROGESTERone (DEPO-PROVERA) 150 MG/ML injection Inject 150 mg into the muscle every 3 (three) months.  [provider]  omeprazole (PRILOSEC) 20 MG capsule Take 1 capsule (20 mg total) by mouth 2 (two) times daily before a meal. 05/05/20   Kennedy-Smith, Malachi Carl, NP  ondansetron (ZOFRAN) 4 MG tablet Take 1-2 tablets (4-8 mg total) by mouth every 6 (six) hours. As needed nausea 02/22/20   Eustace Moore, MD  Vitamin D, Ergocalciferol, (DRISDOL) 1.25 MG (50000 UT) CAPS capsule Take 1 capsule (50,000 Units total) by mouth every 7 (seven) days for 90 doses. 11/06/18 07/22/20  Claiborne Rigg, NP    Allergies    Patient has no known allergies.  Review of Systems   Review of Systems  Constitutional:  Positive for fatigue. Negative for appetite change and fever.  HENT: Positive for congestion. Negative for rhinorrhea.   Respiratory: Positive for shortness of breath. Negative for cough.   Cardiovascular: Negative for chest pain.  Gastrointestinal: Positive for nausea. Negative for abdominal pain and diarrhea.  Genitourinary: Positive for frequency.  Neurological: Positive for numbness.    Physical Exam Updated Vital Signs BP 108/76 (BP Location: Right Arm)   Pulse 96   Temp 99.2 F (37.3 C) (Oral)   Resp 16   Ht 5\' 3"  (1.6 m)   Wt 52.2 kg   SpO2 100%   BMI 20.39 kg/m   Physical Exam Constitutional:      General: She is not in acute distress.    Appearance: She is well-developed. She is not ill-appearing or toxic-appearing.  HENT:     Head: Normocephalic and atraumatic.  Eyes:     Extraocular Movements: Extraocular movements intact.     Pupils: Pupils are equal, round, and reactive to light.  Cardiovascular:     Rate and Rhythm: Normal rate and regular rhythm.     Heart sounds: Normal heart sounds.  Pulmonary:     Effort: Pulmonary effort is normal.     Breath sounds: Normal breath sounds.  Abdominal:     General: Abdomen is flat. Bowel sounds are normal. There is no distension.     Palpations: Abdomen is soft. There is no mass.     Tenderness: There is no abdominal tenderness.  Skin:    General: Skin is warm and dry.  Neurological:     General: No focal deficit present.     Mental Status: She is alert and oriented to person, place, and time.     Cranial Nerves: No cranial nerve deficit.     Motor: No weakness.  Psychiatric:        Mood and Affect: Mood normal.        Behavior: Behavior normal.     ED Results / Procedures / Treatments   Labs (all labs ordered are listed, but only abnormal results are displayed) Labs Reviewed  URINALYSIS, ROUTINE W REFLEX MICROSCOPIC - Abnormal; Notable for the following components:      Result Value   Specific Gravity,  Urine >1.030 (*)    All other components within normal limits  RESP PANEL BY RT-PCR (FLU A&B, COVID) ARPGX2  CBC WITH DIFFERENTIAL/PLATELET    ED Course  I have reviewed the triage vital signs and the nursing notes.  Pertinent labs & imaging results that were available during my care of the patient were reviewed by me and considered in my medical decision making (see chart for details).    MDM Rules/Calculators/A&P  Mary Glover is a 19 y.o. female w/ PMHx of GERD, dysphagia, and constipation presenting due to 1 day of left arm and leg numbness and chronic headache.  No longer experiencing headache or numbness. Presented to ED 2/2 to PCP request due to concerning symptoms. Numbness resolved shortly after patient woke up. Likely due to sleeping position. No acute findings and normal neuro exam. Patient is stable for discharge home with instructions to return if symptoms return and follow up with PCP.   Final Clinical Impression(s) / ED Diagnoses Final diagnoses:  Paresthesia    Rx / DC Orders ED Discharge Orders    None       Autry-Lott, Lafe Clerk, DO 07/08/20 1017    Rolan Bucco, MD 07/08/20 1021

## 2020-07-08 NOTE — Discharge Instructions (Addendum)
Your arm/leg numbness was brief and resolved on its own. Please follow up with your PCP in 2-3 days and return if your symptoms return. We will notify you of your COVID results via Mychart.

## 2020-07-08 NOTE — ED Notes (Signed)
Pt called for vitals x2, no answer 

## 2020-07-09 ENCOUNTER — Ambulatory Visit: Payer: 59 | Admitting: Gastroenterology

## 2020-07-09 ENCOUNTER — Telehealth: Payer: Self-pay | Admitting: Hematology and Oncology

## 2020-07-09 ENCOUNTER — Telehealth: Payer: Self-pay

## 2020-07-09 NOTE — Telephone Encounter (Signed)
Received a call f4rom Ms. Hakeem to reschedule her new hem appt to 1/5 at 9am to see Dr. Leonides Schanz. Pt aware to arrive 30 minutes early.

## 2020-07-09 NOTE — Telephone Encounter (Signed)
Transition Care Management Unsuccessful Follow-up Telephone Call  Date of discharge and from where:  07/08/2020 [facility] ED  Attempts:  1st Attempt  Reason for unsuccessful TCM follow-up call:  Left voice message

## 2020-07-11 ENCOUNTER — Ambulatory Visit: Payer: 59 | Admitting: Gastroenterology

## 2020-07-11 NOTE — Telephone Encounter (Signed)
Transition Care Management Unsuccessful Follow-up Telephone Call  Date of discharge and from where:  07/08/2020 [facility]  Attempts:  2nd Attempt  Reason for unsuccessful TCM follow-up call:  Left voice message

## 2020-07-15 NOTE — Telephone Encounter (Signed)
Transition Care Management Unsuccessful Follow-up Telephone Call  Date of discharge and from where:  07/08/2020 [facility]  Attempts:  3rd Attempt  Reason for unsuccessful TCM follow-up call:  Left voice message

## 2020-08-06 ENCOUNTER — Other Ambulatory Visit: Payer: Self-pay

## 2020-08-06 ENCOUNTER — Inpatient Hospital Stay: Payer: 59

## 2020-08-06 ENCOUNTER — Encounter: Payer: Self-pay | Admitting: Hematology and Oncology

## 2020-08-06 ENCOUNTER — Inpatient Hospital Stay: Payer: 59 | Attending: Hematology and Oncology | Admitting: Hematology and Oncology

## 2020-08-06 VITALS — BP 122/89 | HR 81 | Temp 98.0°F | Resp 13 | Ht 63.0 in | Wt 122.6 lb

## 2020-08-06 DIAGNOSIS — D700 Congenital agranulocytosis: Secondary | ICD-10-CM | POA: Diagnosis not present

## 2020-08-06 DIAGNOSIS — Z793 Long term (current) use of hormonal contraceptives: Secondary | ICD-10-CM | POA: Insufficient documentation

## 2020-08-06 LAB — CMP (CANCER CENTER ONLY)
ALT: 9 U/L (ref 0–44)
AST: 14 U/L — ABNORMAL LOW (ref 15–41)
Albumin: 4.7 g/dL (ref 3.5–5.0)
Alkaline Phosphatase: 42 U/L (ref 38–126)
Anion gap: 7 (ref 5–15)
BUN: 13 mg/dL (ref 6–20)
CO2: 25 mmol/L (ref 22–32)
Calcium: 9.8 mg/dL (ref 8.9–10.3)
Chloride: 107 mmol/L (ref 98–111)
Creatinine: 0.9 mg/dL (ref 0.44–1.00)
GFR, Estimated: >60 mL/min (ref >60)
Glucose, Bld: 84 mg/dL (ref 70–99)
Potassium: 4.1 mmol/L (ref 3.5–5.1)
Sodium: 139 mmol/L (ref 135–145)
Total Bilirubin: 0.5 mg/dL (ref 0.3–1.2)
Total Protein: 8.1 g/dL (ref 6.5–8.1)

## 2020-08-06 LAB — FOLATE: Folate: 14.3 ng/mL (ref >5.9)

## 2020-08-06 LAB — LACTATE DEHYDROGENASE: LDH: 167 U/L (ref 98–192)

## 2020-08-06 LAB — CBC WITH DIFFERENTIAL (CANCER CENTER ONLY)
Abs Immature Granulocytes: 0.01 10*3/uL (ref 0.00–0.07)
Basophils Absolute: 0 10*3/uL (ref 0.0–0.1)
Basophils Relative: 1 %
Eosinophils Absolute: 0 10*3/uL (ref 0.0–0.5)
Eosinophils Relative: 1 %
HCT: 39 % (ref 36.0–46.0)
Hemoglobin: 12.9 g/dL (ref 12.0–15.0)
Immature Granulocytes: 0 %
Lymphocytes Relative: 51 %
Lymphs Abs: 2.2 10*3/uL (ref 0.7–4.0)
MCH: 29.3 pg (ref 26.0–34.0)
MCHC: 33.1 g/dL (ref 30.0–36.0)
MCV: 88.4 fL (ref 80.0–100.0)
Monocytes Absolute: 0.4 10*3/uL (ref 0.1–1.0)
Monocytes Relative: 9 %
Neutro Abs: 1.6 10*3/uL — ABNORMAL LOW (ref 1.7–7.7)
Neutrophils Relative %: 38 %
Platelet Count: 173 10*3/uL (ref 150–400)
RBC: 4.41 MIL/uL (ref 3.87–5.11)
RDW: 13.5 % (ref 11.5–15.5)
WBC Count: 4.3 10*3/uL (ref 4.0–10.5)
nRBC: 0 % (ref 0.0–0.2)

## 2020-08-06 LAB — HEPATITIS B SURFACE ANTIGEN: Hepatitis B Surface Ag: NONREACTIVE

## 2020-08-06 LAB — HEPATITIS B SURFACE ANTIBODY,QUALITATIVE: Hep B S Ab: NONREACTIVE

## 2020-08-06 LAB — HIV ANTIBODY (ROUTINE TESTING W REFLEX): HIV Screen 4th Generation wRfx: NONREACTIVE

## 2020-08-06 LAB — VITAMIN B12: Vitamin B-12: 194 pg/mL (ref 180–914)

## 2020-08-06 LAB — HEPATITIS C ANTIBODY: HCV Ab: NONREACTIVE

## 2020-08-06 NOTE — Progress Notes (Signed)
[place] [phone]   Fax:([phone]) [place] NOTE  Patient Care Team: Gildardo Pounds, NP as PCP - General (Nurse Practitioner)  Hematological/Oncological History # Leukopenia/Neutropenia 05/05/2020: WBC 3.8, Hgb 12.9, Plt 158, ANC 1.1 07/07/2020: WBC 5.3, Hgb 12.9, MCV 87.9, Plt 186 07/08/2020: WBC 4.9, Hgb 13.1, MCV 88.3, Plt 156, ANC 1.8 08/06/2020: establish care with Dr. Lorenso Courier   CHIEF COMPLAINTS/PURPOSE OF CONSULTATION:  "Relative neutropenia "  HISTORY OF PRESENTING ILLNESS:  Mary Glover 20 y.o. female with no significant past medical history who presents for evaluation of relative neutropenia.   On review of the previous records Mary Glover had a CBC collected on 05/05/2020 which showed a white blood cell count of 3.8, hemoglobin 12.9, and a platelet count of 158.  On the differential the patient had 29% neutrophils and 60% lymphocytes.  Her ANC was 1.1.  On 07/07/2020 the patient again underwent a CBC which showed a white blood cell count of 5.3, hemoglobin 12.9, and platelets of 186.  This was rechecked on 07/08/2020 at which time the white blood cell count was 4.9, hemoglobin 13.1, platelets of 156, with a neutrophil count of 37%, lymphocytes of 51%, with an ANC of 1.8.  Due to concern for this patient's relative neutropenia she was referred to hematology for further evaluation management.  On exam today Mary Glover that she has been in and out of the hospital since October 2021.  She has had numerous issues including "my nose and throat closing up on me".  She notes that she has been evaluated by ENT, as a specialist, and GI.  She reports that it was GI who noticed her abnormal blood count and recommended she be evaluated by hematology.  She was placed on numerous medications most of which she has self discontinued because "they were making me depressed".  On further discussion she notes that this is the first time she has been  told about a blood abnormality.  She reports that she has also has not seen a physician through most of 2020 due to the pandemic.  She lost about 10 to 15 pounds which she is beginning to gain back, though she attributes this to a break-up of a bad relationship.  She also knows she has had no frank infections such as pneumonia, urinary tract infections, or diarrhea.  She has not been Covid positive and has not had any symptoms of would make her think that she has had Covid.  Her family history is remarkable for a maternal aunt with sickle cell disease, but otherwise no concerning family medical history.  Of note the patient reports that she did have a unusual diet during 2021 where she was not eating any meat.  She reports that she also did not eat much in the way of vegetables as her family does not cook them "in a healthy way".  She reports now she is eating chicken fish and beef having started back in approximately October 2021.  She currently denies any fevers, chills, sweats, nausea, vomiting or diarrhea.  A full 10 point ROS is listed below.  MEDICAL HISTORY:  Past Medical History:  Diagnosis Date  . Allergy     SURGICAL HISTORY: Past Surgical History:  Procedure Laterality Date  . WISDOM TOOTH EXTRACTION      SOCIAL HISTORY: Social History   Socioeconomic History  . Marital status: Single    Spouse name: Not on file  . Number of children: Not on file  .  Years of education: Not on file  . Highest education level: Not on file  Occupational History  . Not on file  Tobacco Use  . Smoking status: Never Smoker  . Smokeless tobacco: Never Used  Vaping Use  . Vaping Use: Never used  Substance and Sexual Activity  . Alcohol use: Yes    Comment: rare   . Drug use: Yes    Types: Marijuana    Comment: recreational/every once and awhile   . Sexual activity: Yes    Birth control/protection: Injection  Other Topics Concern  . Not on file  Social History Narrative  . Not on file    Social Determinants of Health   Financial Resource Strain: Not on file  Food Insecurity: Not on file  Transportation Needs: Not on file  Physical Activity: Not on file  Stress: Not on file  Social Connections: Not on file  Intimate Partner Violence: Not on file    FAMILY HISTORY: Family History  Problem Relation Age of Onset  . Diabetes Mother   . Hypertension Father   . Pancreatic cancer Maternal Grandfather   . Colon cancer Paternal Grandfather   . Stomach cancer Neg Hx   . Esophageal cancer Neg Hx     ALLERGIES:  has No Known Allergies.  MEDICATIONS:  Current Outpatient Medications  Medication Sig Dispense Refill  . medroxyPROGESTERone (DEPO-PROVERA) 150 MG/ML injection Inject 150 mg into the muscle every 3 (three) months.     No current facility-administered medications for this visit.    REVIEW OF SYSTEMS:   Constitutional: ( - ) fevers, ( - )  chills , ( - ) night sweats Eyes: ( - ) blurriness of vision, ( - ) double vision, ( - ) watery eyes Ears, nose, mouth, throat, and face: ( - ) mucositis, ( - ) sore throat Respiratory: ( - ) cough, ( - ) dyspnea, ( - ) wheezes Cardiovascular: ( - ) palpitation, ( - ) chest discomfort, ( - ) lower extremity swelling Gastrointestinal:  ( - ) nausea, ( - ) heartburn, ( - ) change in bowel habits Skin: ( - ) abnormal skin rashes Lymphatics: ( - ) new lymphadenopathy, ( - ) easy bruising Neurological: ( - ) numbness, ( - ) tingling, ( - ) new weaknesses Behavioral/Psych: ( - ) mood change, ( - ) new changes  All other systems were reviewed with the patient and are negative.  PHYSICAL EXAMINATION:  Vitals:   08/06/20 0909  BP: 122/89  Pulse: 81  Resp: 13  Temp: 98 F (36.7 C)  SpO2: 100%   Filed Weights   08/06/20 0909  Weight: 122 lb 9.6 oz (55.6 kg)    GENERAL: well appearing young African American female in NAD  SKIN: skin color, texture, turgor are normal, no rashes or significant lesions EYES: conjunctiva  are pink and non-injected, sclera clear LYMPH:  no palpable lymphadenopathy in the cervical, axillary or supraclavicular lymph nodes.  LUNGS: clear to auscultation and percussion with normal breathing effort HEART: regular rate & rhythm and no murmurs and no lower extremity edema Musculoskeletal: no cyanosis of digits and no clubbing  PSYCH: alert & oriented x 3, fluent speech NEURO: no focal motor/sensory deficits  LABORATORY DATA:  I have reviewed the data as listed CBC Latest Ref Rng & Units 07/08/2020 07/07/2020 05/05/2020  WBC 4.0 - 10.5 K/uL 4.9 5.3 3.8(L)  Hemoglobin 12.0 - 15.0 g/dL 13.1 12.9 12.9  Hematocrit 36.0 - 46.0 % 39.3 37.8 38.1  Platelets 150 - 400 K/uL 156 186 158.0    CMP Latest Ref Rng & Units 07/07/2020 05/05/2020 03/13/2020  Glucose 70 - 99 mg/dL 93 88 84  BUN 6 - 20 mg/dL '14 13 16  ' Creatinine 0.44 - 1.00 mg/dL 0.98 0.84 0.70  Sodium 135 - 145 mmol/L 137 139 142  Potassium 3.5 - 5.1 mmol/L 3.8 4.5 3.8  Chloride 98 - 111 mmol/L 106 105 107  CO2 22 - 32 mmol/L 23 26 -  Calcium 8.9 - 10.3 mg/dL 9.3 9.9 -  Total Protein 6.5 - 8.1 g/dL 7.4 8.0 -  Total Bilirubin 0.3 - 1.2 mg/dL 0.9 0.5 -  Alkaline Phos 38 - 126 U/L 35(L) 36(L) -  AST 15 - 41 U/L 14(L) 12 -  ALT 0 - 44 U/L 11 9 -    BLOOD FILM: Pending.   RADIOGRAPHIC STUDIES: No results found.  ASSESSMENT & PLAN Mary Glover 20 y.o. female with no significant past medical history who presents for evaluation of relative neutropenia.  After review the labs, the records, schedule the patient the findings most consistent with a relative neutropenia of unclear etiology.  She was noted to have an absolute neutropenia on 05/05/2020 with a neutrophil count of 1.1, however this subsequently resolved with a neutrophil count of 1.8 on 07/08/2020.  There are numerous possible etiologies for the patient's neutropenia.  The possible etiologies include nutritional deficiency, benign ethnic neutropenia, or less likely viral  infection.  The patient notes that through most of 2021 she did not eat any meat and reports that she rarely eats vegetables.  As such she may not been eating appropriately and developed nutritional deficiency.  She began eating meat again on October 2021 and at that time her white blood cell count began rising.  Furthermore we do not know the chronicity of this patient's neutropenia and therefore it may be a congenital abnormality.  Viral etiology is considerably less likely, however for completeness sake we will order HIV, hepatitis B, and hepatitis C.  In the event the above work-up is normal we will plan to see her back in approximately 6 months time for repeat evaluation.  If there are other concerning abnormalities we would consider returning her to clinic earlier for further evaluation.  # Leukopenia/Neutropenia --today will order a CBC, CMP, and peripheral blood film --no clear indication for a bone marrow biopsy at this time --will order vitamin b12 and folate as well. Patient notes she does not eat well and gave up meat for most of 2021.  --viral workup with HIV, Hep B, and Hep C --TSH checked and was normal Oct 2021 --most likley represents a transient drop in the relative number of neutrophils or a chronically low neutrophil count due to benign ethnic neutropenia --RTC in 6 months time or sooner if further evaluation is needed based on the above labs.   Orders Placed This Encounter  Procedures  . CBC with Differential (Cancer Center Only)    Standing Status:   Future    Number of Occurrences:   1    Standing Expiration Date:   08/06/2021  . CMP ([facility] only)    Standing Status:   Future    Number of Occurrences:   1    Standing Expiration Date:   08/06/2021  . Lactate dehydrogenase (LDH)    Standing Status:   Future    Number of Occurrences:   1    Standing Expiration Date:   08/06/2021  . Vitamin  B12    Standing Status:   Future    Number of Occurrences:   1    Standing  Expiration Date:   08/06/2021  . Folate, Serum    Standing Status:   Future    Number of Occurrences:   1    Standing Expiration Date:   08/06/2021  . Hepatitis C antibody    Standing Status:   Future    Number of Occurrences:   1    Standing Expiration Date:   08/06/2021  . Hepatitis B surface antigen    Standing Status:   Future    Number of Occurrences:   1    Standing Expiration Date:   08/06/2021  . Hepatitis B surface antibody    Standing Status:   Future    Number of Occurrences:   1    Standing Expiration Date:   08/06/2021  . HIV antibody (with reflex)    Standing Status:   Future    Number of Occurrences:   1    Standing Expiration Date:   08/06/2021  . Save Smear (SSMR)    Standing Status:   Future    Standing Expiration Date:   08/06/2021    All questions were answered. The patient knows to call the clinic with any problems, questions or concerns.  A total of more than 60 minutes were spent on this encounter and over half of that time was spent on counseling and coordination of care as outlined above.   Ledell Peoples, MD Department of Hematology/Oncology [facility] Phone: [phone] Pager: [phone] Email: [email or web address]  08/06/2020 10:01 AM

## 2020-08-07 ENCOUNTER — Telehealth: Payer: Self-pay | Admitting: *Deleted

## 2020-08-07 ENCOUNTER — Telehealth: Payer: Self-pay | Admitting: Hematology and Oncology

## 2020-08-07 ENCOUNTER — Other Ambulatory Visit: Payer: Self-pay | Admitting: *Deleted

## 2020-08-07 MED ORDER — VITAMIN B-12 1000 MCG PO TABS: 1000.0000 ug | ORAL_TABLET | Freq: Every day | ORAL | 3 refills | Status: DC

## 2020-08-07 NOTE — Telephone Encounter (Signed)
Scheduled per 1/5 los. Called pt and left a msg  °

## 2020-08-07 NOTE — Telephone Encounter (Signed)
TCT patient and spoke to her regarding her lab results from earlier in the week.  Advised that her labs showed only a modest B12 deficiency. Dr. Leonides Schanz recommends she take supplemental B12 . Pt is agreeable to this. Confirmed her pharmacy is [facility] on [address]

## 2020-08-07 NOTE — Telephone Encounter (Signed)
-----   Message from Jaci Standard, MD sent at 08/07/2020  8:49 AM EST ----- Please let Mary Glover know that her labs came back and showed a modest deficiency in Vitamin B12. I would recommend calling in Vitamin b12 PO daily to a pharmacy of her choosing. There was no evidence of a viral infection or other abnormalities in her bloodwork. We will see her back in 6 months to reassess.   ----- Message ----- From: Leory Plowman, [facility] [place] Sent: 08/06/2020  10:15 AM EST To: Jaci Standard, MD

## 2020-09-02 ENCOUNTER — Telehealth: Payer: Self-pay | Admitting: Nurse Practitioner

## 2020-09-02 ENCOUNTER — Other Ambulatory Visit: Payer: Self-pay

## 2020-09-02 ENCOUNTER — Ambulatory Visit: Payer: 59 | Attending: Nurse Practitioner

## 2020-09-02 ENCOUNTER — Other Ambulatory Visit (HOSPITAL_COMMUNITY)
Admission: RE | Admit: 2020-09-02 | Discharge: 2020-09-02 | Disposition: A | Discharge status: Home / Self Care | Payer: 59 | Source: Ambulatory Visit | Attending: Nurse Practitioner | Admitting: Nurse Practitioner

## 2020-09-02 DIAGNOSIS — Z309 Encounter for contraceptive management, unspecified: Secondary | ICD-10-CM

## 2020-09-02 DIAGNOSIS — Z113 Encounter for screening for infections with a predominantly sexual mode of transmission: Secondary | ICD-10-CM

## 2020-09-02 MED ORDER — MEDROXYPROGESTERONE ACETATE 150 MG/ML IM SUSP
150.0000 mg | Freq: Once | INTRAMUSCULAR | Status: AC
  Administered 2020-09-02: 150 mg via INTRAMUSCULAR

## 2020-09-02 NOTE — Telephone Encounter (Signed)
Can orders be placed for the patient to have STD testing or does she have to have an appt with the provider.    Copied from CRM [id number]. Topic: General - Other >> Sep 01, 2020  1:17 PM Laural Benes, [place] C wrote: Reason for CRM: pt called in to schedule her depo shot. / pt says that she would like to also have STD testing.   Please assist pt at: [phone]

## 2020-09-02 NOTE — Telephone Encounter (Signed)
CMA spoke to patient and scheduled a depo injection. Patient also requested STD testing, CMA will have patient do a self swab for cervical ancillary.

## 2020-09-02 NOTE — Progress Notes (Signed)
Patient came in the clinic for her routine depo contraceptive injection.  Injection as given on the right ventral gluteal. Patient tolerated well.   Patient was informed to come back April 20-May 4th for her next injection.

## 2020-09-02 NOTE — Telephone Encounter (Signed)
Pt calling again regarding this appt. Please advise.

## 2020-09-03 LAB — CERVICOVAGINAL ANCILLARY ONLY
Bacterial Vaginitis (gardnerella): POSITIVE — AB
Candida Glabrata: NEGATIVE
Candida Vaginitis: NEGATIVE
Chlamydia: NEGATIVE
Comment: NEGATIVE
Comment: NEGATIVE
Comment: NEGATIVE
Comment: NEGATIVE
Comment: NEGATIVE
Comment: NORMAL
Neisseria Gonorrhea: NEGATIVE
Trichomonas: NEGATIVE

## 2020-09-04 ENCOUNTER — Other Ambulatory Visit: Payer: Self-pay | Admitting: Nurse Practitioner

## 2020-09-04 MED ORDER — METRONIDAZOLE 500 MG PO TABS: 500.0000 mg | ORAL_TABLET | Freq: Two times a day (BID) | ORAL | 0 refills | Status: AC
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
## 2020-11-19 ENCOUNTER — Ambulatory Visit: Payer: 59 | Attending: Nurse Practitioner

## 2020-11-19 ENCOUNTER — Other Ambulatory Visit: Payer: Self-pay

## 2020-11-19 DIAGNOSIS — Z309 Encounter for contraceptive management, unspecified: Secondary | ICD-10-CM | POA: Diagnosis not present

## 2020-11-19 MED ORDER — MEDROXYPROGESTERONE ACETATE 150 MG/ML IM SUSP
150.0000 mg | Freq: Once | INTRAMUSCULAR | Status: AC
  Administered 2020-11-19: 150 mg via INTRAMUSCULAR

## 2020-11-19 NOTE — Patient Instructions (Signed)
Medroxyprogesterone injection [Contraceptive] What is this medicine? MEDROXYPROGESTERONE (me DROX ee proe JES te rone) contraceptive injections prevent pregnancy. They provide effective birth control for 3 months. Depo-SubQ Provera 104 injection is also used for treating pain related to endometriosis. This medicine may be used for other purposes; ask your health care provider or pharmacist if you have questions. COMMON BRAND NAME(S): Depo-Provera, Depo-subQ Provera 104 What should I tell my health care provider before I take this medicine? They need to know if you have any of these conditions:  asthma  blood clots  breast cancer or family history of breast cancer  depression  diabetes  eating disorder (anorexia nervosa)  heart attack  high blood pressure  HIV infection or AIDS  if you often drink alcohol  kidney disease  liver disease  migraine headaches  osteoporosis, weak bones  seizures  stroke  tobacco smoker  vaginal bleeding  an unusual or allergic reaction to medroxyprogesterone, other hormones, medicines, foods, dyes, or preservatives  pregnant or trying to get pregnant  breast-feeding How should I use this medicine? Depo-Provera CI contraceptive injection is given into a muscle. Depo-subQ Provera 104 injection is given under the skin. It is given by a health care provider in a hospital or clinic setting. The injection is usually given during the first 5 days after the start of a menstrual period or 6 weeks after delivery of a baby. A patient package insert for the product will be given with each prescription and refill. Be sure to read this information carefully each time. The sheet may change often. Talk to your pediatrician regarding the use of this medicine in children. Special care may be needed. These injections have been used in female children who have started having menstrual periods. Overdosage: If you think you have taken too much of this  medicine contact a poison control center or emergency room at once. NOTE: This medicine is only for you. Do not share this medicine with others. What if I miss a dose? Keep appointments for follow-up doses. You must get an injection once every 3 months. It is important not to miss your dose. Call your health care provider if you are unable to keep an appointment. What may interact with this medicine?  antibiotics or medicines for infections, especially rifampin and griseofulvin  antivirals for HIV or hepatitis  aprepitant  armodafinil  bexarotene  bosentan  medicines for seizures like carbamazepine, felbamate, oxcarbazepine, phenytoin, phenobarbital, primidone, topiramate  mitotane  modafinil  St. John's wort This list may not describe all possible interactions. Give your health care provider a list of all the medicines, herbs, non-prescription drugs, or dietary supplements you use. Also tell them if you smoke, drink alcohol, or use illegal drugs. Some items may interact with your medicine. What should I watch for while using this medicine? This drug does not protect you against HIV infection (AIDS) or other sexually transmitted diseases. Use of this product may cause you to lose calcium from your bones. Loss of calcium may cause weak bones (osteoporosis). Only use this product for more than 2 years if other forms of birth control are not right for you. The longer you use this product for birth control the more likely you will be at risk for weak bones. Ask your health care professional how you can keep strong bones. You may have a change in bleeding pattern or irregular periods. Many females stop having periods while taking this drug. If you have received your injections on time, your   chance of being pregnant is very low. If you think you may be pregnant, see your health care professional as soon as possible. Tell your health care professional if you want to get pregnant within the  next year. The effect of this medicine may last a long time after you get your last injection. What side effects may I notice from receiving this medicine? Side effects that you should report to your doctor or health care professional as soon as possible:  allergic reactions like skin rash, itching or hives, swelling of the face, lips, or tongue  blood clot (chest pain; shortness of breath; pain, swelling, or warmth in the leg)  breast tenderness or discharge  changes in emotions or moods  changes in vision  liver injury (dark yellow or brown urine; general ill feeling or flu-like symptoms; loss of appetite, right upper belly pain; unusually weak or tired, yellowing of the eyes or skin)  persistent pain, pus, or bleeding at the injection site  stroke (changes in vision; confusion; trouble speaking or understanding; severe headaches; sudden numbness or weakness of the face, arm or leg; trouble walking; dizziness; loss of balance or coordination)  trouble breathing Side effects that usually do not require medical attention (report to your doctor or health care professional if they continue or are bothersome):  change in sex drive  dizziness  fluid retention  headache  irregular periods, spotting, or absent periods  pain, redness, or irritation at site where injected  stomach pain  weight gain This list may not describe all possible side effects. Call your doctor for medical advice about side effects. You may report side effects to FDA at 1-800-FDA-1088. Where should I keep my medicine? This injection is only given by a health care provider. It will not be stored at home. NOTE: This sheet is a summary. It may not cover all possible information. If you have questions about this medicine, talk to your doctor, pharmacist, or health care provider.  2021 Elsevier/Gold Standard (2019-09-05 10:29:21)  

## 2021-01-20 ENCOUNTER — Encounter: Payer: Self-pay | Admitting: Nurse Practitioner

## 2021-01-20 ENCOUNTER — Other Ambulatory Visit: Payer: Self-pay

## 2021-01-20 ENCOUNTER — Ambulatory Visit: Payer: 59 | Attending: Nurse Practitioner | Admitting: Nurse Practitioner

## 2021-01-20 VITALS — BP 104/72 | HR 90 | Resp 12 | Wt 121.0 lb

## 2021-01-20 DIAGNOSIS — S3669XA Other injury of rectum, initial encounter: Secondary | ICD-10-CM

## 2021-01-20 MED ORDER — LIDOCAINE (ANORECTAL) 5 % EX GEL: CUTANEOUS | 1 refills | Status: AC

## 2021-01-20 MED ORDER — HYDROCORTISONE (PERIANAL) 2.5 % EX CREA: 1.0000 "application " | TOPICAL_CREAM | Freq: Two times a day (BID) | CUTANEOUS | 0 refills | Status: AC

## 2021-01-20 NOTE — Progress Notes (Signed)
Anal irritation  Has noticed bleeding at times when she wipes Intermittent pain

## 2021-01-20 NOTE — Progress Notes (Signed)
Assessment & Plan:  Mary Glover was seen today for anal itching.  Diagnoses and all orders for this visit:  Rectal tear -     Lidocaine, Anorectal, 5 % GEL; Apply to rectum as needed -     hydrocortisone (ANUSOL-HC) 2.5 % rectal cream; Place 1 application rectally 2 (two) times daily.   Patient has been counseled on age-appropriate routine health concerns for screening and prevention. These are reviewed and up-to-date. Referrals have been placed accordingly. Immunizations are up-to-date or declined.    Subjective:   Chief Complaint  Patient presents with   Anal Itching   HPI Mary Glover 20 y.o. female presents to office today with concerns of possible hemorrhoid.   Hemorrhoids: Patient complains of evaluation of possible hemorrhoids.   She describes symptoms as anorectal itching, constipation, painful perianal swelling, and perianal tags. Treatment to date has been none. Patient denies family hx of colorectal CA, known or suspected STD exposure, maroon colored stools, and melena.   Review of Systems  Constitutional:  Negative for fever, malaise/fatigue and weight loss.  HENT: Negative.  Negative for nosebleeds.   Eyes: Negative.  Negative for blurred vision, double vision and photophobia.  Respiratory: Negative.  Negative for cough and shortness of breath.   Cardiovascular: Negative.  Negative for chest pain, palpitations and leg swelling.  Gastrointestinal:  Positive for constipation. Negative for abdominal pain, blood in stool, diarrhea, heartburn, melena, nausea and vomiting.       SEE HPI  Musculoskeletal: Negative.  Negative for myalgias.  Neurological: Negative.  Negative for dizziness, focal weakness, seizures and headaches.  Psychiatric/Behavioral: Negative.  Negative for suicidal ideas.    Past Medical History:  Diagnosis Date   Allergy     Past Surgical History:  Procedure Laterality Date   WISDOM TOOTH EXTRACTION      Family History  Problem Relation Age  of Onset   Diabetes Mother    Hypertension Father    Pancreatic cancer Maternal Grandfather    Colon cancer Paternal Grandfather    Stomach cancer Neg Hx    Esophageal cancer Neg Hx     Social History Reviewed with no changes to be made today.   Outpatient Medications Prior to Visit  Medication Sig Dispense Refill   medroxyPROGESTERone (DEPO-PROVERA) 150 MG/ML injection Inject 150 mg into the muscle every 3 (three) months.     vitamin B-12 (CYANOCOBALAMIN) 1000 MCG tablet Take 1 tablet (1,000 mcg total) by mouth daily. (Patient not taking: Reported on 01/20/2021) 30 tablet 3   No facility-administered medications prior to visit.    No Known Allergies     Objective:    BP 104/72 (BP Location: Left Arm, Patient Position: Sitting, Cuff Size: Normal)   Pulse 90   Resp 12   Wt 121 lb (54.9 kg)   LMP  (LMP Unknown)   SpO2 99%   BMI 21.43 kg/m  Wt Readings from Last 3 Encounters:  01/20/21 121 lb (54.9 kg)  08/06/20 122 lb 9.6 oz (55.6 kg)  07/08/20 115 lb 1.3 oz (52.2 kg)    Physical Exam       Patient has been counseled extensively about nutrition and exercise as well as the importance of adherence with medications and regular follow-up. The patient was given clear instructions to go to ER or return to medical center if symptoms don't improve, worsen or new problems develop. The patient verbalized understanding.   Follow-up: Return in about 3 months (around 04/22/2021) for PAP SMEAR.   Mary Glover  Mary Molder, FNP-BC [facility] [place], [place] [phone]   01/20/2021, 3:47 PM

## 2021-02-04 ENCOUNTER — Ambulatory Visit: Payer: 59 | Admitting: Hematology and Oncology

## 2021-02-04 ENCOUNTER — Other Ambulatory Visit: Payer: 59

## 2021-02-04 ENCOUNTER — Other Ambulatory Visit: Payer: Self-pay | Admitting: Hematology and Oncology

## 2021-02-04 DIAGNOSIS — D700 Congenital agranulocytosis: Secondary | ICD-10-CM

## 2021-02-04 NOTE — Progress Notes (Signed)
No show

## 2021-02-10 ENCOUNTER — Other Ambulatory Visit: Payer: Self-pay

## 2021-02-10 ENCOUNTER — Ambulatory Visit: Payer: 59 | Attending: Nurse Practitioner

## 2021-02-10 DIAGNOSIS — Z309 Encounter for contraceptive management, unspecified: Secondary | ICD-10-CM | POA: Diagnosis not present

## 2021-02-10 MED ORDER — MEDROXYPROGESTERONE ACETATE 150 MG/ML IM SUSP
150.0000 mg | Freq: Once | INTRAMUSCULAR | Status: AC
  Administered 2021-02-10: 150 mg via INTRAMUSCULAR

## 2021-02-10 NOTE — Progress Notes (Signed)
Pt came into the office for depo injection Pt is on time for shot No HCG needed Pt tolerated well   Pt will be due for next depo between Sept 27-Oct 11

## 2021-02-10 NOTE — Patient Instructions (Signed)
Medroxyprogesterone Injection (Contraception) What is this medication? MEDROXYPROGESTERONE (me DROX ee proe JES te rone) prevents ovulation and pregnancy. It belongs to a group of medications called contraceptives. Thismedication is a progestin hormone. This medicine may be used for other purposes; ask your health care provider orpharmacist if you have questions. COMMON BRAND NAME(S): Depo-Provera, Depo-subQ Provera 104 What should I tell my care team before I take this medication? They need to know if you have any of these conditions: Asthma Blood clots Breast cancer or family history of breast cancer Depression Diabetes Eating disorder (anorexia nervosa) Heart attack High blood pressure HIV infection or AIDS If you often drink alcohol Kidney disease Liver disease Migraine headaches Osteoporosis, weak bones Seizures Stroke Tobacco smoker Vaginal bleeding An unusual or allergic reaction to medroxyprogesterone, other hormones, medications, foods, dyes, or preservatives Pregnant or trying to get pregnant Breast-feeding How should I use this medication? Depo-Provera CI contraceptive injection is given into a muscle. Depo-subQ Provera 104 injection is given under the skin. It is given in a hospital or clinic setting. The injection is usually given during the first 5 days afterthe start of a menstrual period or 6 weeks after delivery of a baby. A patient package insert for the product will be given with each prescription and refill. Be sure to read this information carefully each time. The sheet maychange often. Talk to your care team about the use of this medication in children. Special care may be needed. These injections have been used in female children who havestarted having menstrual periods. Overdosage: If you think you have taken too much of this medicine contact apoison control center or emergency room at once. NOTE: This medicine is only for you. Do not share this medicine with  others. What if I miss a dose? Keep appointments for follow-up doses. You must get an injection once every 3 months. It is important not to miss your dose. Call your care team if you areunable to keep an appointment. What may interact with this medication? Antibiotics or medications for infections, especially rifampin and griseofulvin Antivirals for HIV or hepatitis Aprepitant Armodafinil Bexarotene Bosentan Medications for seizures like carbamazepine, felbamate, oxcarbazepine, phenytoin, phenobarbital, primidone, topiramate Mitotane Modafinil St. John's wort This list may not describe all possible interactions. Give your health care provider a list of all the medicines, herbs, non-prescription drugs, or dietary supplements you use. Also tell them if you smoke, drink alcohol, or use illegaldrugs. Some items may interact with your medicine. What should I watch for while using this medication? This medication does not protect you against HIV infection (AIDS) or othersexually transmitted diseases. Use of this product may cause you to lose calcium from your bones. Loss of calcium may cause weak bones (osteoporosis). Only use this product for more than 2 years if other forms of birth control are not right for you. The longer you use this product for birth control the more likely you will be at risk forweak bones. Ask your care team how you can keep strong bones. You may have a change in bleeding pattern or irregular periods. Many femalesstop having periods while taking this medication. If you have received your injections on time, your chance of being pregnant is very low. If you think you may be pregnant, see your care team as soon aspossible. Tell your care team if you want to get pregnant within the next year. The effect of this medication may last a long time after you get your lastinjection. What side effects may I   notice from receiving this medication? Side effects that you should report to  your care team as soon as possible: Allergic reactions-skin rash, itching, hives, swelling of the face, lips, tongue, or throat Blood clot-pain, swelling, or warmth in the leg, shortness of breath, chest pain Gallbladder problems-severe stomach pain, nausea, vomiting, fever Increase in blood pressure Liver injury-right upper belly pain, loss of appetite, nausea, light-colored stool, dark yellow or brown urine, yellowing skin or eyes, unusual weakness or fatigue New or worsening migraines or headaches Seizures Stroke-sudden numbness or weakness of the face, arm, or leg, trouble speaking, confusion, trouble walking, loss of balance or coordination, dizziness, severe headache, change in vision Unusual vaginal discharge, itching, or odor Worsening mood, feelings of depression Side effects that usually do not require medical attention (report to your careteam if they continue or are bothersome): Breast pain or tenderness Dark patches of the skin on the face or other sun-exposed areas Irregular menstrual cycles or spotting Nausea Weight gain This list may not describe all possible side effects. Call your doctor for medical advice about side effects. You may report side effects to FDA at1-800-FDA-1088. Where should I keep my medication? This injection is only given by a care team. It will not be stored at home. NOTE: This sheet is a summary. It may not cover all possible information. If you have questions about this medicine, talk to your doctor, pharmacist, orhealth care provider.  2022 Elsevier/Gold Standard (2020-08-25 12:23:33)  

## 2021-03-08 DIAGNOSIS — S76311D Strain of muscle, fascia and tendon of the posterior muscle group at thigh level, right thigh, subsequent encounter: Secondary | ICD-10-CM | POA: Diagnosis not present

## 2021-05-15 DIAGNOSIS — Z3040 Encounter for surveillance of contraceptives, unspecified: Secondary | ICD-10-CM | POA: Diagnosis not present

## 2021-05-15 DIAGNOSIS — J00 Acute nasopharyngitis [common cold]: Secondary | ICD-10-CM | POA: Diagnosis not present

## 2021-05-15 DIAGNOSIS — Z3202 Encounter for pregnancy test, result negative: Secondary | ICD-10-CM | POA: Diagnosis not present

## 2021-05-15 DIAGNOSIS — M25561 Pain in right knee: Secondary | ICD-10-CM | POA: Diagnosis not present

## 2021-10-09 ENCOUNTER — Encounter (HOSPITAL_BASED_OUTPATIENT_CLINIC_OR_DEPARTMENT_OTHER): Payer: Self-pay | Admitting: Emergency Medicine

## 2021-10-09 ENCOUNTER — Other Ambulatory Visit: Payer: Self-pay

## 2021-10-09 ENCOUNTER — Emergency Department (HOSPITAL_BASED_OUTPATIENT_CLINIC_OR_DEPARTMENT_OTHER)
Admission: EM | Admit: 2021-10-09 | Discharge: 2021-10-09 | Disposition: A | Discharge status: Home / Self Care | Payer: 59 | Attending: Emergency Medicine | Admitting: Emergency Medicine

## 2021-10-09 DIAGNOSIS — Z20822 Contact with and (suspected) exposure to covid-19: Secondary | ICD-10-CM | POA: Diagnosis not present

## 2021-10-09 DIAGNOSIS — J029 Acute pharyngitis, unspecified: Secondary | ICD-10-CM | POA: Diagnosis present

## 2021-10-09 DIAGNOSIS — J039 Acute tonsillitis, unspecified: Secondary | ICD-10-CM | POA: Diagnosis not present

## 2021-10-09 LAB — RESP PANEL BY RT-PCR (FLU A&B, COVID) ARPGX2
Influenza A by PCR: NEGATIVE
Influenza B by PCR: NEGATIVE
SARS Coronavirus 2 by RT PCR: NEGATIVE

## 2021-10-09 LAB — GROUP A STREP BY PCR: Group A Strep by PCR: NOT DETECTED

## 2021-10-09 MED ORDER — ACETAMINOPHEN 160 MG/5ML PO SOLN
650.0000 mg | Freq: Once | ORAL | Status: AC
  Administered 2021-10-09: 650 mg via ORAL

## 2021-10-09 MED ORDER — ACETAMINOPHEN 160 MG/5ML PO SOLN
ORAL | Status: AC
  Filled 2021-10-09: qty 20.3

## 2021-10-09 MED ORDER — AMOXICILLIN 500 MG PO CAPS: 500.0000 mg | ORAL_CAPSULE | Freq: Two times a day (BID) | ORAL | 0 refills | Status: AC

## 2021-10-09 NOTE — ED Notes (Signed)
EMT-P provided AVS using Teachback Method. Patient verbalizes understanding of Discharge Instructions. Opportunity for Questioning and Answers were provided by EMT-P. Patient Discharged from ED.  ? ?

## 2021-10-09 NOTE — ED Provider Notes (Signed)
? ?[facility] EM[facility]PT  ?Provider Note ? ?CSN: [id number] ?Arrival date & time: 10/09/21 1932 ? ?History ?Chief Complaint  ?Patient presents with  ? Fever  ? Sore Throat  ? ? ?Mary Glover is a 21 y.o. female with chronic nasal congestion reports 2 days of sore throat, worse with swallowing and fever. Similar to previous tonsillitis. She has been followed by ENT in the past.  ? ? ?Home Medications ?Prior to Admission medications   ?Medication Sig Start Date End Date Taking? Authorizing Provider  ?amoxicillin (AMOXIL) 500 MG capsule Take 1 capsule (500 mg total) by mouth 2 (two) times daily for 10 days. 10/09/21 10/19/21 Yes Pollyann Savoy, MD  ?hydrocortisone (ANUSOL-HC) 2.5 % rectal cream Place 1 application rectally 2 (two) times daily. 01/20/21   Claiborne Rigg, NP  ?Lidocaine, Anorectal, 5 % GEL Apply to rectum as needed 01/20/21   Claiborne Rigg, NP  ?medroxyPROGESTERone (DEPO-PROVERA) 150 MG/ML injection Inject 150 mg into the muscle every 3 (three) months.    [provider]  ? ? ? ?Allergies    ?Patient has no known allergies. ? ? ?Review of Systems   ?Review of Systems ?Please see HPI for pertinent positives and negatives ? ?Physical Exam ?BP 114/82 (BP Location: Right Arm)   Pulse 95   Temp 98.9 ?F (37.2 ?C) (Oral)   Resp 20   Ht 5\' 3"  (1.6 m)   Wt 56.7 kg   SpO2 100%   BMI 22.14 kg/m?  ? ?Physical Exam ?Vitals and nursing note reviewed.  ?Constitutional:   ?   Appearance: Normal appearance.  ?HENT:  ?   Head: Normocephalic and atraumatic.  ?   Nose: Nose normal.  ?   Mouth/Throat:  ?   Mouth: Mucous membranes are moist.  ?   Tonsils: Tonsillar exudate present.  ?   Comments: Difficult to fully visualize tonsils as patient unable to relax her tongue even with a tongue depressor but the visualized parts of the tonsils have exudate on both sides ?Eyes:  ?   Extraocular Movements: Extraocular movements intact.  ?   Conjunctiva/sclera: Conjunctivae normal.   ?Cardiovascular:  ?   Rate and Rhythm: Normal rate.  ?Pulmonary:  ?   Effort: Pulmonary effort is normal.  ?   Breath sounds: Normal breath sounds.  ?Abdominal:  ?   General: Abdomen is flat.  ?   Palpations: Abdomen is soft.  ?   Tenderness: There is no abdominal tenderness.  ?Musculoskeletal:     ?   General: No swelling. Normal range of motion.  ?   Cervical back: Neck supple.  ?Lymphadenopathy:  ?   Cervical: Cervical adenopathy present.  ?Skin: ?   General: Skin is warm and dry.  ?Neurological:  ?   General: No focal deficit present.  ?   Mental Status: She is alert.  ?Psychiatric:     ?   Mood and Affect: Mood normal.  ? ? ?ED Results / Procedures / Treatments   ?EKG ?None ? ?Procedures ?Procedures ? ?Medications Ordered in the ED ?Medications  ?acetaminophen (TYLENOL) 160 MG/5ML solution (  Not Given 10/09/21 2001)  ?acetaminophen (TYLENOL) 160 MG/5ML solution 650 mg (650 mg Oral Given 10/09/21 1959)  ? ? ?Initial Impression and Plan ? Patient with symptoms consistent with strep pharyngitis, has 4/4 Centor criteria, despite neg Strep test, suspect poor sample collection given difficulties with visualizing the tonsils. Will treat empirically for strep. Recommend ENT follow up.  ? ?ED Course  ? ?  ? ? ?  MDM Rules/Calculators/A&P ?Medical Decision Making ?Problems Addressed: ?Tonsillitis: acute illness or injury ? ?Amount and/or Complexity of Data Reviewed ?Labs: ordered. Decision-making details documented in ED Course. ? ?Risk ?OTC drugs. ?Prescription drug management. ? ? ? ?Final Clinical Impression(s) / ED Diagnoses ?Final diagnoses:  ?Tonsillitis  ? ? ?Rx / DC Orders ?ED Discharge Orders   ? ?      Ordered  ?  amoxicillin (AMOXIL) 500 MG capsule  2 times daily       ? 10/09/21 2312  ? ?  ?  ? ?  ? ?  ?Pollyann Savoy, MD ?10/09/21 2312 ? ?

## 2021-10-09 NOTE — ED Triage Notes (Signed)
?  Patient comes in with fever and sore throat that started yesterday afternoon and got progressively worse today.  Patient took zyrtec and motrin 800 mg at 1130 this morning.  Patient endorses nasal congestion, headache, sore throat, and dysphagia.  Febrile in triage.  Pain 8/10, sharp/burning. ?

## 2021-10-12 ENCOUNTER — Telehealth: Payer: Self-pay

## 2021-10-12 NOTE — Telephone Encounter (Signed)
Transition Care Management Follow-up Telephone Call ?Date of discharge and from where: 10/09/2021 from [facility] ?How have you been since you were released from the hospital? Patient stated that she is feeling better and did not have any questions or concerns.  ?Any questions or concerns? No ? ?Items Reviewed: ?Did the pt receive and understand the discharge instructions provided? Yes  ?Medications obtained and verified? Yes  ?Other? No  ?Any new allergies since your discharge? No  ?Dietary orders reviewed? No ?Do you have support at home? Yes  ? ?Functional Questionnaire: (I = Independent and D = Dependent) ?ADLs: I ? ?Bathing/Dressing- I ? ?Meal Prep- I ? ?Eating- I ? ?Maintaining continence- I ? ?Transferring/Ambulation- I ? ?Managing Meds- I ? ? ?Follow up appointments reviewed: ? ?PCP Hospital f/u appt confirmed? No   ?Specialist Hospital f/u appt confirmed? No   ?Are transportation arrangements needed? No  ?If their condition worsens, is the pt aware to call PCP or go to the Emergency Dept.? Yes ?Was the patient provided with contact information for the PCP's office or ED? Yes ?Was to pt encouraged to call back with questions or concerns? Yes ? ?

## 2022-09-11 ENCOUNTER — Encounter (HOSPITAL_BASED_OUTPATIENT_CLINIC_OR_DEPARTMENT_OTHER): Payer: Self-pay | Admitting: Emergency Medicine

## 2022-09-11 ENCOUNTER — Emergency Department (HOSPITAL_BASED_OUTPATIENT_CLINIC_OR_DEPARTMENT_OTHER)
Admission: EM | Admit: 2022-09-11 | Discharge: 2022-09-11 | Disposition: A | Discharge status: Home / Self Care | Payer: 59 | Attending: Emergency Medicine | Admitting: Emergency Medicine

## 2022-09-11 ENCOUNTER — Other Ambulatory Visit: Payer: Self-pay

## 2022-09-11 DIAGNOSIS — R000 Tachycardia, unspecified: Secondary | ICD-10-CM | POA: Diagnosis not present

## 2022-09-11 DIAGNOSIS — J320 Chronic maxillary sinusitis: Secondary | ICD-10-CM | POA: Diagnosis not present

## 2022-09-11 DIAGNOSIS — U071 COVID-19: Secondary | ICD-10-CM | POA: Insufficient documentation

## 2022-09-11 DIAGNOSIS — R509 Fever, unspecified: Secondary | ICD-10-CM | POA: Diagnosis present

## 2022-09-11 DIAGNOSIS — H6691 Otitis media, unspecified, right ear: Secondary | ICD-10-CM | POA: Diagnosis not present

## 2022-09-11 DIAGNOSIS — H6690 Otitis media, unspecified, unspecified ear: Secondary | ICD-10-CM

## 2022-09-11 LAB — RESP PANEL BY RT-PCR (RSV, FLU A&B, COVID)  RVPGX2
Influenza A by PCR: NEGATIVE
Influenza B by PCR: NEGATIVE
Resp Syncytial Virus by PCR: NEGATIVE
SARS Coronavirus 2 by RT PCR: POSITIVE — AB

## 2022-09-11 MED ORDER — ACETAMINOPHEN 500 MG PO TABS
1000.0000 mg | ORAL_TABLET | Freq: Once | ORAL | Status: AC
  Administered 2022-09-11: 1000 mg via ORAL
  Filled 2022-09-11: qty 2

## 2022-09-11 MED ORDER — AMOXICILLIN-POT CLAVULANATE 875-125 MG PO TABS: 1.0000 | ORAL_TABLET | Freq: Two times a day (BID) | ORAL | 0 refills | Status: AC

## 2022-09-11 NOTE — ED Provider Notes (Signed)
[facility] Provider Note   CSN: [id number] Arrival date & time: 09/11/22  1015     History  Chief Complaint  Patient presents with   Fever    Mary Glover is a 22 y.o. female with chronic sinusitis who presented with 3 days of fevers/chills and sinus pressure.  Patient states she was looking for Tylenol at home as she had a fever 101 Fahrenheit and was able to find any and came to the ED.  Patient states she has sinus pressure which causes headache all around her head to the point where she becomes tearful.  Patient endorsed dry cough.  Patient denied chest pain, shortness of breath, abdominal pain, syncope, nausea/vomiting/diarrhea, confusion, neck stiffness, blurry vision, ear pain  Home Medications Prior to Admission medications   Medication Sig Start Date End Date Taking? Authorizing Provider  amoxicillin-clavulanate (AUGMENTIN) 875-125 MG tablet Take 1 tablet by mouth every 12 (twelve) hours. 09/11/22  Yes Alexanderjames Berg, Florene Route, PA-C  hydrocortisone (ANUSOL-HC) 2.5 % rectal cream Place 1 application rectally 2 (two) times daily. 01/20/21   Gildardo Pounds, NP  Lidocaine, Anorectal, 5 % GEL Apply to rectum as needed 01/20/21   Gildardo Pounds, NP  medroxyPROGESTERone (DEPO-PROVERA) 150 MG/ML injection Inject 150 mg into the muscle every 3 (three) months.    [provider]      Allergies    Patient has no known allergies.    Review of Systems   Review of Systems  Constitutional:  Positive for fever.  See HPI  Physical Exam Updated Vital Signs BP 113/82   Pulse (!) 102   Temp 98.3 F (36.8 C) (Oral)   Resp (!) 22   SpO2 100%  Physical Exam Vitals and nursing note reviewed.  Constitutional:      General: She is not in acute distress.    Appearance: She is well-developed.  HENT:     Head: Normocephalic and atraumatic.     Comments: Tenderness over maxillary sinus with palpation    Right Ear: Tympanic membrane is  erythematous. Tympanic membrane is not perforated.     Left Ear: Tympanic membrane is not perforated or erythematous.  Eyes:     Extraocular Movements: Extraocular movements intact.     Conjunctiva/sclera: Conjunctivae normal.     Pupils: Pupils are equal, round, and reactive to light.  Cardiovascular:     Rate and Rhythm: Regular rhythm. Tachycardia present.     Pulses: Normal pulses.     Heart sounds: No murmur heard. Pulmonary:     Effort: Pulmonary effort is normal. No respiratory distress.     Breath sounds: Normal breath sounds.  Abdominal:     Palpations: Abdomen is soft.     Tenderness: There is no abdominal tenderness.  Musculoskeletal:        General: No swelling.     Cervical back: Normal range of motion and neck supple.  Skin:    General: Skin is warm and dry.     Capillary Refill: Capillary refill takes less than 2 seconds.  Neurological:     General: No focal deficit present.     Mental Status: She is alert and oriented to person, place, and time.  Psychiatric:        Mood and Affect: Mood normal.     ED Results / Procedures / Treatments   Labs (all labs ordered are listed, but only abnormal results are displayed) Labs Reviewed  RESP PANEL BY RT-PCR (RSV, FLU  A&B, COVID)  RVPGX2    EKG None  Radiology No results found.  Procedures Procedures    Medications Ordered in ED Medications  acetaminophen (TYLENOL) tablet 1,000 mg (1,000 mg Oral Given 09/11/22 1125)    ED Course/ Medical Decision Making/ A&P                             Medical Decision Making Risk OTC drugs.   Genisys Mackall 22 y.o. presented today for sinus pain and fever. Working DDx that I considered at this time includes, but not limited to, viral/bacterial sinusitis, AOM, strep pharyngitis, meningitis.  Review of prior external notes: 10/09/2021 ED provider notes  Unique Tests and My Interpretation:  Respiratory panel:  Discussion with Independent Historian:  None  Discussion of Management of Tests: None  Risk:   Medium:  - prescription drug management  Risk Stratification Score: Centor: 1 fever  Staffed with Tegeler, MD  R/o DDx: Strep pharyngitis: Low suspicion at this time as her center score is 1 Meningitis: No neck stiffness and patient did not endorse any confusion  Plan: Patient presented with fever and sinus pressure.  Patient has a history of chronic sinusitis however on exam the right tympanic membrane appeared erythematous and so the otitis media could be exacerbating her sinusitis.  Patient is tachycardic with a fever of 100.4 F.  After receiving Tylenol patient's temperature decreased to 98.3 F and her pulse rate decreased to 102 bpm.  I suspect the tachycardia is related to the fever as opposed to cardiac pathology in origin.  Patient will be given Tylenol here and respiratory panel was drawn.  Augmentin will be given as to cover both the otitis media and possible sinusitis.  Patient will be encouraged to follow-up with primary care provider and to alternate between Tylenol and ibuprofen as needed every 6 hours for pain.  Patient is stable for discharge.  Patient has access to MyChart.  Patient verbalized agreement to this plan.         Final Clinical Impression(s) / ED Diagnoses Final diagnoses:  Acute otitis media, unspecified otitis media type  Chronic maxillary sinusitis    Rx / DC Orders ED Discharge Orders          Ordered    amoxicillin-clavulanate (AUGMENTIN) 875-125 MG tablet  Every 12 hours        09/11/22 1221              Chuck Hint, PA-C 09/11/22 1222    Tegeler, Gwenyth Allegra, MD 09/11/22 [phone]

## 2022-09-11 NOTE — ED Triage Notes (Signed)
Pt congested, pressure to her head /nose/area, hard to breathe due to congestion. Fevers,subjective .

## 2022-09-11 NOTE — Discharge Instructions (Addendum)
Please pick up the antibiotic I prescribed for you and please make an appointment with your primary care provider to be seen in the next few days regarding recent ER visit.  If symptoms worsen please return to ER.

## 2023-07-28 ENCOUNTER — Emergency Department (HOSPITAL_BASED_OUTPATIENT_CLINIC_OR_DEPARTMENT_OTHER): Payer: 59 | Admitting: Radiology

## 2023-07-28 ENCOUNTER — Encounter (HOSPITAL_BASED_OUTPATIENT_CLINIC_OR_DEPARTMENT_OTHER): Payer: Self-pay | Admitting: Emergency Medicine

## 2023-07-28 ENCOUNTER — Emergency Department (HOSPITAL_BASED_OUTPATIENT_CLINIC_OR_DEPARTMENT_OTHER)
Admission: EM | Admit: 2023-07-28 | Discharge: 2023-07-28 | Disposition: A | Discharge status: Home / Self Care | Payer: 59 | Attending: Emergency Medicine | Admitting: Emergency Medicine

## 2023-07-28 ENCOUNTER — Other Ambulatory Visit: Payer: Self-pay

## 2023-07-28 DIAGNOSIS — Z1152 Encounter for screening for COVID-19: Secondary | ICD-10-CM | POA: Insufficient documentation

## 2023-07-28 DIAGNOSIS — R509 Fever, unspecified: Secondary | ICD-10-CM | POA: Diagnosis present

## 2023-07-28 DIAGNOSIS — J101 Influenza due to other identified influenza virus with other respiratory manifestations: Secondary | ICD-10-CM | POA: Diagnosis not present

## 2023-07-28 DIAGNOSIS — E119 Type 2 diabetes mellitus without complications: Secondary | ICD-10-CM | POA: Diagnosis not present

## 2023-07-28 LAB — RESP PANEL BY RT-PCR (RSV, FLU A&B, COVID)  RVPGX2
Influenza A by PCR: POSITIVE — AB
Influenza B by PCR: NEGATIVE
Resp Syncytial Virus by PCR: NEGATIVE
SARS Coronavirus 2 by RT PCR: NEGATIVE

## 2023-07-28 LAB — URINALYSIS, W/ REFLEX TO CULTURE (INFECTION SUSPECTED)
Bacteria, UA: NONE SEEN
Bilirubin Urine: NEGATIVE
Glucose, UA: NEGATIVE mg/dL
Hgb urine dipstick: NEGATIVE
Leukocytes,Ua: NEGATIVE
Nitrite: NEGATIVE
Protein, ur: 30 mg/dL — AB
Specific Gravity, Urine: 1.043 — ABNORMAL HIGH (ref 1.005–1.030)
pH: 6 (ref 5.0–8.0)

## 2023-07-28 LAB — CBG MONITORING, ED: Glucose-Capillary: 93 mg/dL (ref 70–99)

## 2023-07-28 MED ORDER — ACETAMINOPHEN 325 MG PO TABS
650.0000 mg | ORAL_TABLET | Freq: Once | ORAL | Status: DC
  Filled 2023-07-28: qty 2

## 2023-07-28 MED ORDER — IBUPROFEN 200 MG PO TABS
600.0000 mg | ORAL_TABLET | Freq: Once | ORAL | Status: AC
  Administered 2023-07-28: 600 mg via ORAL
  Filled 2023-07-28: qty 1

## 2023-07-28 NOTE — ED Notes (Signed)
ED Provider at bedside. 

## 2023-07-28 NOTE — ED Notes (Signed)
Discharge instructions reviewed and explained, pt verbalized understanding and had no further questions on d/c. Pt caox4, ambulatory, NAD on d/c.

## 2023-07-28 NOTE — ED Provider Notes (Signed)
[place] EMERGENCY DEPARTMENT AT [facility] Provider Note   CSN: [id number] Arrival date & time: 07/28/23  0730     History  Chief Complaint  Patient presents with   Fever    Mary Glover is a 22 y.o. female.  22 year old female with no past medical history presents with 1 to 2 days of URI symptoms.  States that she has had a scratchy throat as well as itchy ears.  Notes cough with some shortness of breath.  Endorses polyuria.  Denies any polydipsia.  No vomiting or diarrhea.  Does endorse some nasal congestion.  Has used over-the-counter medications without relief       Home Medications Prior to Admission medications   Medication Sig Start Date End Date Taking? Authorizing Provider  amoxicillin-clavulanate (AUGMENTIN) 875-125 MG tablet Take 1 tablet by mouth every 12 (twelve) hours. 09/11/22   Netta Corrigan, PA-C  hydrocortisone (ANUSOL-HC) 2.5 % rectal cream Place 1 application rectally 2 (two) times daily. 01/20/21   Claiborne Rigg, NP  Lidocaine, Anorectal, 5 % GEL Apply to rectum as needed 01/20/21   Claiborne Rigg, NP  medroxyPROGESTERone (DEPO-PROVERA) 150 MG/ML injection Inject 150 mg into the muscle every 3 (three) months.    [provider]      Allergies    Patient has no known allergies.    Review of Systems   Review of Systems  All other systems reviewed and are negative.   Physical Exam Updated Vital Signs BP 117/79   Pulse (!) 123   Temp 99.3 F (37.4 C) (Oral)   Resp 20   SpO2 99%  Physical Exam Vitals and nursing note reviewed.  Constitutional:      General: She is not in acute distress.    Appearance: Normal appearance. She is well-developed. She is not toxic-appearing.  HENT:     Head: Normocephalic and atraumatic.  Eyes:     General: Lids are normal.     Conjunctiva/sclera: Conjunctivae normal.     Pupils: Pupils are equal, round, and reactive to light.  Neck:     Thyroid: No thyroid mass.     Trachea: No  tracheal deviation.  Cardiovascular:     Rate and Rhythm: Regular rhythm. Tachycardia present.     Heart sounds: Normal heart sounds. No murmur heard.    No gallop.  Pulmonary:     Effort: Pulmonary effort is normal. No respiratory distress.     Breath sounds: Normal breath sounds. No stridor. No decreased breath sounds, wheezing, rhonchi or rales.  Abdominal:     General: There is no distension.     Palpations: Abdomen is soft.     Tenderness: There is no abdominal tenderness. There is no rebound.  Musculoskeletal:        General: No tenderness. Normal range of motion.     Cervical back: Normal range of motion and neck supple.  Skin:    General: Skin is warm and dry.     Findings: No abrasion or rash.  Neurological:     Mental Status: She is alert and oriented to person, place, and time. Mental status is at baseline.     GCS: GCS eye subscore is 4. GCS verbal subscore is 5. GCS motor subscore is 6.     Cranial Nerves: No cranial nerve deficit.     Sensory: No sensory deficit.     Motor: Motor function is intact.  Psychiatric:        Attention and Perception:  Attention normal.        Speech: Speech normal.        Behavior: Behavior normal.     ED Results / Procedures / Treatments   Labs (all labs ordered are listed, but only abnormal results are displayed) Labs Reviewed  RESP PANEL BY RT-PCR (RSV, FLU A&B, COVID)  RVPGX2  CBG MONITORING, ED    EKG None  Radiology No results found.  Procedures Procedures    Medications Ordered in ED Medications - No data to display  ED Course/ Medical Decision Making/ A&P                                 Medical Decision Making Amount and/or Complexity of Data Reviewed Radiology: ordered.   Patient's chest x-ray per my interpretation shows no acute findings.  Urinalysis negative.  CBG was 93.  Evidence of diabetes at this time.  Flu test was positive for flu A.  She is nontoxic-appearing will discharge  home        Final Clinical Impression(s) / ED Diagnoses Final diagnoses:  None    Rx / DC Orders ED Discharge Orders     None         Lorre Nick, MD 07/28/23 [phone]

## 2023-07-28 NOTE — ED Triage Notes (Signed)
Pt reports fever last night. Also reports "itchy" throat, chest tightness, wheezing"  today and cough x 2 days. Also c/o HA. 1g tylenol at 0400

## 2023-12-01 ENCOUNTER — Emergency Department (HOSPITAL_BASED_OUTPATIENT_CLINIC_OR_DEPARTMENT_OTHER)
Admission: EM | Admit: 2023-12-01 | Discharge: 2023-12-01 | Disposition: A | Discharge status: Home / Self Care | Attending: Emergency Medicine | Admitting: Emergency Medicine

## 2023-12-01 ENCOUNTER — Other Ambulatory Visit: Payer: Self-pay

## 2023-12-01 DIAGNOSIS — S6992XA Unspecified injury of left wrist, hand and finger(s), initial encounter: Secondary | ICD-10-CM | POA: Diagnosis present

## 2023-12-01 DIAGNOSIS — S61012A Laceration without foreign body of left thumb without damage to nail, initial encounter: Secondary | ICD-10-CM | POA: Insufficient documentation

## 2023-12-01 DIAGNOSIS — X58XXXA Exposure to other specified factors, initial encounter: Secondary | ICD-10-CM | POA: Diagnosis not present

## 2023-12-01 NOTE — ED Provider Notes (Signed)
 Supervised resident visit.  Patient here with small laceration to the back of her left thumb.  Hemostatic.  Tetanus shot updated.  Wound washed out and laceration repaired by resident.  Neurovascular neuromuscular intact.  Discharged in good condition.  No concern for ligamentous or deep space injury.  This chart was dictated using voice recognition software.  Despite best efforts to proofread,  errors can occur which can change the documentation meaning.    Lowery Rue, DO 12/01/23 1147

## 2023-12-01 NOTE — ED Triage Notes (Signed)
 Small lac to left thumb. Bleeding controlled. Unsure about tetanus.

## 2023-12-01 NOTE — ED Notes (Signed)
 Dc instructions reviewed with patient. Patient voiced understanding. Dc with belongings.

## 2023-12-01 NOTE — Discharge Instructions (Addendum)
 We placed dermabond (glue) on the cut on your thumb. This will keep the skin together while it heals. Once the glue has gone away you can remove the steri-strip. If you have new redness, swelling or worsening pain these can be signs of infection and will need to reevaluated.

## 2023-12-01 NOTE — ED Provider Notes (Cosign Needed)
  EMERGENCY DEPARTMENT AT [facility] Provider Note   CSN: [id number] Arrival date & time: 12/01/23  1103     History  Chief Complaint  Patient presents with   Laceration    Mary Glover is a 23 y.o. female.  Patient is a 23 year old with no pertinent past medical history who presents with left thumb laceration.  She accidentally nicked her thumb while opening an Survey[employer], mining with a box opening tool.  States that it bled through almost a full paper towel initially.  However has now stopped bleeding with a second beaver tail.  She is in minimal pain and is not taking anything for pain. came to the ED at the recommendation of her family.  She continues to have full range of motion of her thumb with no noticeable deficits.   Laceration      Home Medications Prior to Admission medications   Medication Sig Start Date End Date Taking? Authorizing Provider  amoxicillin -clavulanate (AUGMENTIN ) 875-125 MG tablet Take 1 tablet by mouth every 12 (twelve) hours. 09/11/22   Denese Finn, PA-C  DEPO-SUBQ PROVERA  104 104 MG/0.65ML injection Inject 104 mg into the skin every 3 (three) months. 07/24/23   [provider]  hydrocortisone  (ANUSOL -HC) 2.5 % rectal cream Place 1 application rectally 2 (two) times daily. 01/20/21   Collins Dean, NP  Lidocaine , Anorectal, 5 % GEL Apply to rectum as needed 01/20/21   Fleming, Zelda W, NP      Allergies    Patient has no known allergies.    Review of Systems   Review of Systems  Physical Exam Updated Vital Signs BP 114/80 (BP Location: Left Arm)   Pulse 72   Temp 97.7 F (36.5 C) (Oral)   Resp 18   SpO2 99%  Physical Exam Vitals and nursing note reviewed.  Constitutional:      General: She is not in acute distress.    Appearance: She is well-developed.  HENT:     Head: Normocephalic and atraumatic.     Nose: Nose normal.     Mouth/Throat:     Mouth: Mucous membranes are moist.  Eyes:      Conjunctiva/sclera: Conjunctivae normal.  Cardiovascular:     Rate and Rhythm: Normal rate and regular rhythm.  Pulmonary:     Effort: Pulmonary effort is normal. No respiratory distress.  Abdominal:     Palpations: Abdomen is soft.  Musculoskeletal:        General: No swelling.     Cervical back: Neck supple.     Comments: Small 1 cm curved laceration to the dorsal side of the left thumb.  No visible extensor tendon, not dirty. Full ROM in abduction, adduction, extension, flexion of thumb. Strength 5/5 all directions.  Skin:    General: Skin is warm and dry.  Neurological:     General: No focal deficit present.     Mental Status: She is alert.  Psychiatric:        Mood and Affect: Mood normal.     ED Results / Procedures / Treatments   Labs (all labs ordered are listed, but only abnormal results are displayed) Labs Reviewed - No data to display  EKG None  Radiology No results found.  Procedures .Laceration Repair  Date/Time: 12/01/2023 11:31 AM  Performed by: Ivin Marrow, MD Authorized by: Lowery Rue, DO   Consent:    Consent obtained:  Verbal   Consent given by:  Patient   Risks, benefits, and alternatives  were discussed: yes     Risks discussed:  Infection and pain   Alternatives discussed:  No treatment and observation Universal protocol:    Procedure explained and questions answered to patient or proxy's satisfaction: yes     Site/side marked: yes     Patient identity confirmed:  Verbally with patient Anesthesia:    Anesthesia method:  None Laceration details:    Location:  Finger   Finger location:  L thumb   Length (cm):  1   Depth (mm):  1 Pre-procedure details:    Patient was prepped and draped in usual sterile fashion: Wound was irrigated with sterile normal saline. Exploration:    Wound exploration: entire depth of wound visualized     Contaminated: no   Treatment:    Area cleansed with:  Saline   Amount of cleaning:  Standard    Irrigation solution:  Sterile saline   Irrigation method:  Syringe   Visualized foreign bodies/material removed: no     Debridement:  None Skin repair:    Repair method:  Steri-Strips   Number of Steri-Strips:  1 Approximation:    Approximation:  Close Comments:     Dermabond applied along length of laceration.     Medications Ordered in ED Medications - No data to display  ED Course/ Medical Decision Making/ A&P                                 Medical Decision Making 1 cm laceration to the dorsal aspect of the left thumb. Hemostatic on arrival. Full ROM of thumb and full strength in all directions. Patient expressed minimal pain, declined pain medications. Laceration repaired as above. Return precautions discussed for infection and laceration repair care provided.           Final Clinical Impression(s) / ED Diagnoses Final diagnoses:  Thumb laceration, left, initial encounter    Rx / DC Orders ED Discharge Orders     None         Ivin Marrow, MD 12/01/23 1158
